# Patient Record
Sex: Female | Born: 2000
Health system: Southern US, Community
[De-identification: ages and names within clinical notes are randomized; demographics above are authoritative.]

## PROBLEM LIST (undated history)

## (undated) ENCOUNTER — Inpatient Hospital Stay (HOSPITAL_COMMUNITY): Payer: Self-pay

## (undated) DIAGNOSIS — Z8759 Personal history of other complications of pregnancy, childbirth and the puerperium: Secondary | ICD-10-CM

## (undated) DIAGNOSIS — S82899A Other fracture of unspecified lower leg, initial encounter for closed fracture: Secondary | ICD-10-CM

## (undated) DIAGNOSIS — D649 Anemia, unspecified: Secondary | ICD-10-CM

## (undated) DIAGNOSIS — N390 Urinary tract infection, site not specified: Secondary | ICD-10-CM

## (undated) DIAGNOSIS — S42302A Unspecified fracture of shaft of humerus, left arm, initial encounter for closed fracture: Secondary | ICD-10-CM

## (undated) HISTORY — DX: Anemia, unspecified: D64.9

## (undated) HISTORY — DX: Personal history of other complications of pregnancy, childbirth and the puerperium: Z87.59

## (undated) HISTORY — PX: NO PAST SURGERIES: SHX2092

---
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
## 2020-11-02 NOTE — L&D Delivery Note (Signed)
Delivery Note Labor onset: 08/07/2021  Labor Onset Time: 08/07/2021 @2000  Complete dilation at 4:03 PM  Onset of pushing at 4:03 PM FHR second stage Cat 1 Analgesia/Anesthesia intrapartum: Epidural  Guided pushing with maternal urge. Delivery of a viable female at 54. Fetal head delivered in ROP position.  Nuchal cord: none.  Infant placed on maternal abd, dried, and tactile stim.  Cord double clamped after 2 minutes and cut by dad.  Dr. 60 and 3 Rns present for birth.  Cord blood sample collected: yes Arterial cord blood sample collected: no  Placenta was retained and delivered by manual removal by Dr. Normand Sloop at 25 minutes after delivery.  Appeared intact, with 3 VC.  Placenta to L&D. Uterine tone boggy with brisk bleeding after placenta delivered. Methergine given and pitocin was already running. Uterine tone was firm and bleeding was WNL.   No laceration identified.  Repair: none QBL/EBL (mL): 400 Complications: Retained placenta APGAR: APGAR (1 MIN): 9   APGAR (5 MINS): 9   APGAR (10 MINS):   Mom to postpartum.  Baby to Couplet care / Skin to Skin.  Plan for IV Unasyn and methergine course for 24 hours per Dr. Normand Sloop.    Normand Sloop MSN, CNM 08/08/2021, 6:03 PM

## 2021-01-09 LAB — OB RESULTS CONSOLE ABO/RH: RH Type: POSITIVE

## 2021-01-09 LAB — OB RESULTS CONSOLE GC/CHLAMYDIA
Chlamydia: NEGATIVE
Gonorrhea: NEGATIVE

## 2021-01-09 LAB — OB RESULTS CONSOLE RUBELLA ANTIBODY, IGM: Rubella: IMMUNE

## 2021-01-09 LAB — OB RESULTS CONSOLE ANTIBODY SCREEN: Antibody Screen: NEGATIVE

## 2021-01-09 LAB — OB RESULTS CONSOLE RPR: RPR: NONREACTIVE

## 2021-01-09 LAB — OB RESULTS CONSOLE HIV ANTIBODY (ROUTINE TESTING): HIV: NONREACTIVE

## 2021-01-09 LAB — OB RESULTS CONSOLE HEPATITIS B SURFACE ANTIGEN: Hepatitis B Surface Ag: NEGATIVE

## 2021-01-11 ENCOUNTER — Encounter (HOSPITAL_COMMUNITY): Payer: Self-pay | Admitting: Obstetrics and Gynecology

## 2021-01-11 ENCOUNTER — Inpatient Hospital Stay (HOSPITAL_COMMUNITY)
Admission: AD | Admit: 2021-01-11 | Discharge: 2021-01-11 | Disposition: A | Discharge status: Home / Self Care | Payer: BC Managed Care – PPO | Source: Ambulatory Visit | Attending: Obstetrics and Gynecology | Admitting: Obstetrics and Gynecology

## 2021-01-11 ENCOUNTER — Other Ambulatory Visit: Payer: Self-pay

## 2021-01-11 DIAGNOSIS — Z3A11 11 weeks gestation of pregnancy: Secondary | ICD-10-CM | POA: Diagnosis not present

## 2021-01-11 DIAGNOSIS — O219 Vomiting of pregnancy, unspecified: Secondary | ICD-10-CM

## 2021-01-11 DIAGNOSIS — O210 Mild hyperemesis gravidarum: Secondary | ICD-10-CM | POA: Diagnosis not present

## 2021-01-11 DIAGNOSIS — O368310 Maternal care for abnormalities of the fetal heart rate or rhythm, first trimester, not applicable or unspecified: Secondary | ICD-10-CM

## 2021-01-11 DIAGNOSIS — O368390 Maternal care for abnormalities of the fetal heart rate or rhythm, unspecified trimester, not applicable or unspecified: Secondary | ICD-10-CM

## 2021-01-11 LAB — URINALYSIS, ROUTINE W REFLEX MICROSCOPIC
Bilirubin Urine: NEGATIVE
Glucose, UA: NEGATIVE mg/dL
Ketones, ur: 20 mg/dL — AB
Nitrite: NEGATIVE
Protein, ur: 30 mg/dL — AB
RBC / HPF: >50 RBC/hpf — ABNORMAL HIGH (ref 0–5)
Specific Gravity, Urine: 1.025 (ref 1.005–1.030)
pH: 5 (ref 5.0–8.0)

## 2021-01-11 LAB — POCT PREGNANCY, URINE: Preg Test, Ur: POSITIVE — AB

## 2021-01-11 MED ORDER — LACTATED RINGERS IV BOLUS
1000.0000 mL | Freq: Once | INTRAVENOUS | Status: AC
  Administered 2021-01-11: 1000 mL via INTRAVENOUS

## 2021-01-11 MED ORDER — PROMETHAZINE HCL 25 MG PO TABS: 25.0000 mg | ORAL_TABLET | Freq: Three times a day (TID) | ORAL | 0 refills | Status: DC | PRN

## 2021-01-11 MED ORDER — FAMOTIDINE 40 MG PO TABS: 40.0000 mg | ORAL_TABLET | Freq: Every evening | ORAL | 0 refills | Status: AC

## 2021-01-11 MED ORDER — FAMOTIDINE IN NACL 20-0.9 MG/50ML-% IV SOLN
20.0000 mg | Freq: Once | INTRAVENOUS | Status: AC
  Administered 2021-01-11: 20 mg via INTRAVENOUS
  Filled 2021-01-11: qty 50

## 2021-01-11 MED ORDER — PROMETHAZINE HCL 25 MG/ML IJ SOLN
25.0000 mg | Freq: Once | INTRAMUSCULAR | Status: AC
  Administered 2021-01-11: 25 mg via INTRAVENOUS
  Filled 2021-01-11: qty 1

## 2021-01-11 MED ORDER — PROMETHAZINE HCL 25 MG RE SUPP: 25.0000 mg | Freq: Three times a day (TID) | RECTAL | 0 refills | Status: DC | PRN

## 2021-01-11 NOTE — Discharge Instructions (Signed)
Morning Sickness  Morning sickness is when a woman feels nauseous during pregnancy. This nauseous feeling may or may not come with vomiting. It often occurs in the morning, but it can be a problem at any time of day. Morning sickness is most common during the first trimester. In some cases, it may continue throughout pregnancy. Although morning sickness is unpleasant, it is usually harmless unless the woman develops severe and continual vomiting (hyperemesis gravidarum), a condition that requires more intense treatment. What are the causes? The exact cause of this condition is not known, but it seems to be related to normal hormonal changes that occur in pregnancy. What increases the risk? You are more likely to develop this condition if:  You experienced nausea or vomiting before your pregnancy.  You had morning sickness during a previous pregnancy.  You are pregnant with more than one baby, such as twins. What are the signs or symptoms? Symptoms of this condition include:  Nausea.  Vomiting. How is this diagnosed? This condition is usually diagnosed based on your signs and symptoms. How is this treated? In many cases, treatment is not needed for this condition. Making some changes to what you eat may help to control symptoms. Your health care provider may also prescribe or recommend:  Vitamin B6 supplements.  Anti-nausea medicines.  Ginger. Follow these instructions at home: Medicines  Take over-the-counter and prescription medicines only as told by your health care provider. Do not use any prescription, over-the-counter, or herbal medicines for morning sickness without first talking with your health care provider.  Take multivitamins before getting pregnant. This can prevent or decrease the severity of morning sickness in most women. Eating and drinking  Eat a piece of dry toast or crackers before getting out of bed in the morning.  Eat 5 or 6 small meals a day.  Eat dry  and bland foods, such as rice or a baked potato. Foods that are high in carbohydrates are often helpful.  Avoid greasy, fatty, and spicy foods.  Have someone cook for you if the smell of any food causes nausea and vomiting.  If you feel nauseous after taking prenatal vitamins, take the vitamins at night or with a snack.  Eat a protein snack between meals if you are hungry. Nuts, yogurt, and cheese are good options.  Drink fluids throughout the day.  Try ginger ale made with real ginger, ginger tea made from fresh grated ginger, or ginger candies. General instructions  Do not use any products that contain nicotine or tobacco. These products include cigarettes, chewing tobacco, and vaping devices, such as e-cigarettes. If you need help quitting, ask your health care provider.  Get an air purifier to keep the air in your house free of odors.  Get plenty of fresh air.  Try to avoid odors that trigger your nausea.  Consider trying these methods to help relieve symptoms: ? Wearing an acupressure wristband. These wristbands are often worn for seasickness. ? Acupuncture. Contact a health care provider if:  Your home remedies are not working and you need medicine.  You feel dizzy or light-headed.  You are losing weight. Get help right away if:  You have persistent and uncontrolled nausea and vomiting.  You faint.  You have severe pain in your abdomen. Summary  Morning sickness is when a woman feels nauseous during pregnancy. This nauseous feeling may or may not come with vomiting.  Morning sickness is most common during the first trimester.  It often occurs in the   morning, but it can be a problem at any time of day.  In many cases, treatment is not needed for this condition. Making some changes to what you eat may help to control symptoms. This information is not intended to replace advice given to you by your health care provider. Make sure you discuss any questions you have  with your health care provider. Document Revised: 06/03/2020 Document Reviewed: 05/13/2020 Elsevier Patient Education  2021 [employer]  

## 2021-01-11 NOTE — MAU Note (Incomplete)
Pt reports she has had N/V for several days. Not able to keep much down. Has been taking dicleges, zofran, and promethazine with out much relief.

## 2021-01-11 NOTE — MAU Provider Note (Signed)
History     CSN: [id number]  Arrival date and time: 01/11/21 2024   Event Date/Time   First Provider Initiated Contact with Patient 01/11/21 2135      Chief Complaint  Patient presents with  . Nausea  . Emesis   HPI Linzi Ohlinger is a 20 y.o. [name] at [redacted]w[redacted]d who presents with nausea & vomiting. This has been an ongoing issue with her pregnancy. Was previously taking diclegis & zofran; saw her ob on Thursday & was prescribed phenergan - was told to stop taking diclegis & zofran.  Took phenergan at 9 am this morning. No treatment for her symptoms since then. Reports vomiting 4 times today. Endorses some epigastric tenderness. Denies abdominal pain, fever, vaginal bleeding, diarrhea. Goes to [facility]; has appointment at the end of this month.   OB History    Gravida  2   Para  1   Term  1   Preterm      AB      Living  1     SAB      IAB      Ectopic      Multiple  0   Live Births  1           Past Medical History:  Diagnosis Date  . Anemia   . Ankle fracture    Right  . Arm fracture, left   . UTI (urinary tract infection)     Past Surgical History:  Procedure Laterality Date  . NO PAST SURGERIES      History reviewed. No pertinent family history.  Social History   Tobacco Use  . Smoking status: Never Smoker  . Smokeless tobacco: Never Used  Substance Use Topics  . Alcohol use: Never  . Drug use: Never    Allergies: No Known Allergies  Medications Prior to Admission  Medication Sig Dispense Refill Last Dose  . Doxylamine-Pyridoxine 10-10 MG TBEC Take 2 tablets by mouth daily.     . ondansetron (ZOFRAN-ODT) 4 MG disintegrating tablet Take 4 mg by mouth 2 (two) times daily as needed.     . Prenatal Vit-Fe Fumarate-FA (PRENATAL MULTIVITAMIN) TABS tablet Take 1 tablet by mouth daily at 12 noon.     . [DISCONTINUED] promethazine (PHENERGAN) 25 MG suppository SMARTSIG:2 SUPPOS Rectally Twice Daily PRN     . [DISCONTINUED] promethazine  (PHENERGAN) 25 MG tablet Take 25 mg by mouth every 6 (six) hours as needed.       Review of Systems  Constitutional: Negative.   Gastrointestinal: Positive for nausea and vomiting. Negative for abdominal pain, constipation and diarrhea.  Genitourinary: Negative.    Physical Exam   Blood pressure 108/74, pulse (!) 103, temperature 98.4 F (36.9 C), resp. rate 18, height 5\' 7"  (1.702 m), weight 76.2 kg, last menstrual period 10/26/2020, unknown if currently breastfeeding.  Physical Exam Vitals and nursing note reviewed.  Constitutional:      General: She is not in acute distress.    Appearance: Normal appearance.  HENT:     Head: Normocephalic and atraumatic.  Pulmonary:     Effort: Pulmonary effort is normal. No respiratory distress.  Neurological:     Mental Status: She is alert.  Psychiatric:        Mood and Affect: Mood normal.        Behavior: Behavior normal.     MAU Course  Procedures Results for orders placed or performed during the hospital encounter of 01/11/21 (from the past 24 hour(s))  Pregnancy, urine POC     Status: Abnormal   Collection Time: 01/11/21  8:46 PM  Result Value Ref Range   Preg Test, Ur POSITIVE (A) NEGATIVE  Urinalysis, Routine w reflex microscopic Urine, Clean Catch     Status: Abnormal   Collection Time: 01/11/21  9:00 PM  Result Value Ref Range   Color, Urine AMBER (A) YELLOW   APPearance CLOUDY (A) CLEAR   Specific Gravity, Urine 1.025 1.005 - 1.030   pH 5.0 5.0 - 8.0   Glucose, UA NEGATIVE NEGATIVE mg/dL   Hgb urine dipstick LARGE (A) NEGATIVE   Bilirubin Urine NEGATIVE NEGATIVE   Ketones, ur 20 (A) NEGATIVE mg/dL   Protein, ur 30 (A) NEGATIVE mg/dL   Nitrite NEGATIVE NEGATIVE   Leukocytes,Ua MODERATE (A) NEGATIVE   RBC / HPF >50 (H) 0 - 5 RBC/hpf   WBC, UA 11-20 0 - 5 WBC/hpf   Bacteria, UA MANY (A) NONE SEEN   Squamous Epithelial / LPF 21-50 0 - 5   Mucus PRESENT     MDM RN unable to doppler FHT. BSUS performed.   Pt  informed that the ultrasound is considered a limited OB ultrasound and is not intended to be a complete ultrasound exam.  Patient also informed that the ultrasound is not being completed with the intent of assessing for fetal or placental anomalies or any pelvic abnormalities.  Explained that the purpose of today's ultrasound is to assess for  viability.  Patient acknowledges the purpose of the exam and the limitations of the study.  Active fetus, FHR 174 bpm.   IV fluids, phenergan, and pepcid given. No vomiting in MAU and reports improvement in symptoms. Discussed taking at home meds on a schedule to cover symptoms. Will add pepcid to meds.    Assessment and Plan   1. Nausea and vomiting during pregnancy prior to [redacted] weeks gestation   2. Unable to hear fetal heart tones as reason for ultrasound scan   3. [redacted] weeks gestation of pregnancy    -Rx pepcid -continued zofran & phenergan on a schedule -reviewed reasons to return to MAU  Judeth Horn 01/11/2021, 11:19 PM

## 2021-01-13 LAB — CULTURE, OB URINE

## 2021-03-25 DIAGNOSIS — G43909 Migraine, unspecified, not intractable, without status migrainosus: Secondary | ICD-10-CM | POA: Insufficient documentation

## 2021-06-11 ENCOUNTER — Inpatient Hospital Stay (HOSPITAL_COMMUNITY)
Admission: AD | Admit: 2021-06-11 | Discharge: 2021-06-12 | Disposition: A | Discharge status: Home / Self Care | Payer: BC Managed Care – PPO | Attending: Obstetrics and Gynecology | Admitting: Obstetrics and Gynecology

## 2021-06-11 ENCOUNTER — Encounter (HOSPITAL_COMMUNITY): Payer: Self-pay | Admitting: Obstetrics and Gynecology

## 2021-06-11 ENCOUNTER — Other Ambulatory Visit: Payer: Self-pay

## 2021-06-11 DIAGNOSIS — N3000 Acute cystitis without hematuria: Secondary | ICD-10-CM

## 2021-06-11 DIAGNOSIS — R102 Pelvic and perineal pain: Secondary | ICD-10-CM | POA: Diagnosis present

## 2021-06-11 DIAGNOSIS — Z3A32 32 weeks gestation of pregnancy: Secondary | ICD-10-CM | POA: Diagnosis not present

## 2021-06-11 DIAGNOSIS — O4700 False labor before 37 completed weeks of gestation, unspecified trimester: Secondary | ICD-10-CM

## 2021-06-11 DIAGNOSIS — O4703 False labor before 37 completed weeks of gestation, third trimester: Secondary | ICD-10-CM | POA: Diagnosis not present

## 2021-06-11 NOTE — MAU Note (Signed)
..  Beverly Ramos is a 20 y.o. at [redacted]w[redacted]d here in MAU reporting: Vaginal pain that is constant and began today. She took tylenol and cranberry pill because she says it is similar to her UTI pain. Denies contractions and vaginal bleeding. +FM.  Pain score: 7/10  Lab orders placed from triage: UA

## 2021-06-12 DIAGNOSIS — O4703 False labor before 37 completed weeks of gestation, third trimester: Secondary | ICD-10-CM

## 2021-06-12 DIAGNOSIS — Z3A32 32 weeks gestation of pregnancy: Secondary | ICD-10-CM | POA: Diagnosis not present

## 2021-06-12 LAB — URINALYSIS, ROUTINE W REFLEX MICROSCOPIC
Bilirubin Urine: NEGATIVE
Glucose, UA: NEGATIVE mg/dL
Hgb urine dipstick: NEGATIVE
Ketones, ur: NEGATIVE mg/dL
Leukocytes,Ua: NEGATIVE
Nitrite: POSITIVE — AB
Protein, ur: NEGATIVE mg/dL
Specific Gravity, Urine: 1.003 — ABNORMAL LOW (ref 1.005–1.030)
pH: 7 (ref 5.0–8.0)

## 2021-06-12 MED ORDER — NITROFURANTOIN MONOHYD MACRO 100 MG PO CAPS: 100.0000 mg | ORAL_CAPSULE | Freq: Two times a day (BID) | ORAL | 1 refills | Status: DC

## 2021-06-12 MED ORDER — NIFEDIPINE 10 MG PO CAPS
10.0000 mg | ORAL_CAPSULE | ORAL | Status: AC | PRN
  Administered 2021-06-12 (×4): 10 mg via ORAL
  Filled 2021-06-12 (×4): qty 1

## 2021-06-12 MED ORDER — AMMONIA AROMATIC IN INHA
RESPIRATORY_TRACT | Status: AC
  Filled 2021-06-12: qty 10

## 2021-06-12 NOTE — MAU Provider Note (Signed)
Chief Complaint:  Pelvic Pain   Event Date/Time   First Provider Initiated Contact with Patient 06/12/21 0001     HPI: Beverly Ramos is a 20 y.o. [name] at 56w5dwho presents to maternity admissions reporting burning in vagina.  States it feels like UTIs she has had in the past.  Tried several remedies without success.  No fever or flank pain. . She reports good fetal movement, denies LOF, vaginal bleeding, vaginal itching/burning, urinary symptoms, h/a, dizziness, n/v, diarrhea, constipation or fever/chills.  She denies headache, visual changes or RUQ abdominal pain.  Pelvic Pain The patient's primary symptoms include pelvic pain. The patient's pertinent negatives include no genital itching, genital lesions, genital odor or vaginal bleeding. This is a new problem. The current episode started today. The problem has been unchanged. She is pregnant. Pertinent negatives include no back pain, chills, constipation, diarrhea, fever, frequency, nausea or vomiting. Nothing aggravates the symptoms. Treatments tried: Tylenol, cranberry pills. The treatment provided no relief.   RN Note: Beverly Ramos is a 20 y.o. at [redacted]w[redacted]d here in MAU reporting: Vaginal pain that is constant and began today. She took tylenol and cranberry pill because she says it is similar to her UTI pain. Denies contractions and vaginal bleeding. +FM.    Past Medical History: Past Medical History:  Diagnosis Date   Anemia    Ankle fracture    Right   Arm fracture, left    UTI (urinary tract infection)     Past obstetric history: OB History  Gravida Para Term Preterm AB Living  2 1 1     1   SAB IAB Ectopic Multiple Live Births        0 1    # Outcome Date GA Lbr Len/2nd Weight Sex Delivery Anes PTL Lv  2 Current           1 Term 05/25/18 [redacted]w[redacted]d 02:15 / 03:07 4145 g M Vag-Spont EPI  LIV    Past Surgical History: Past Surgical History:  Procedure Laterality Date   NO PAST SURGERIES      Family History: History  reviewed. No pertinent family history.  Social History: Social History   Tobacco Use   Smoking status: Never   Smokeless tobacco: Never  Vaping Use   Vaping Use: Never used  Substance Use Topics   Alcohol use: Never   Drug use: Never    Allergies: No Known Allergies  Meds:  Medications Prior to Admission  Medication Sig Dispense Refill Last Dose   Prenatal Vit-Fe Fumarate-FA (PRENATAL MULTIVITAMIN) TABS tablet Take 1 tablet by mouth daily at 12 noon.   06/10/2021   famotidine (PEPCID) 40 MG tablet Take 1 tablet (40 mg total) by mouth every evening. 30 tablet 0    ondansetron (ZOFRAN-ODT) 4 MG disintegrating tablet Take 4 mg by mouth 2 (two) times daily as needed.      promethazine (PHENERGAN) 25 MG suppository Place 1 suppository (25 mg total) rectally every 8 (eight) hours as needed for nausea or vomiting. 12 each 0    promethazine (PHENERGAN) 25 MG tablet Take 1 tablet (25 mg total) by mouth every 8 (eight) hours as needed. 30 tablet 0     I have reviewed patient's Past Medical Hx, Surgical Hx, Family Hx, Social Hx, medications and allergies.   ROS:  Review of Systems  Constitutional:  Negative for chills and fever.  Gastrointestinal:  Negative for constipation, diarrhea, nausea and vomiting.  Genitourinary:  Positive for pelvic pain. Negative for frequency.  Musculoskeletal:  Negative for back pain.  Other systems negative  Physical Exam  Patient Vitals for the past 24 hrs:  BP Temp Temp src Pulse Resp SpO2 Height Weight  06/11/21 2348 121/73 97.8 F (36.6 C) Oral 80 18 99 % -- --  06/11/21 2341 -- -- -- -- -- -- 5\' 7"  (1.702 m) 95 kg   Constitutional: Well-developed, well-nourished female in no acute distress.  Cardiovascular: normal rate and rhythm Respiratory: normal effort, clear to auscultation bilaterally GI: Abd soft, non-tender, gravid appropriate for gestational age.   No rebound or guarding. MS: Extremities nontender, no edema, normal ROM Neurologic: Alert  and oriented x 4.  GU: Neg CVAT.  PELVIC EXAM:  Cervix long and closed  FHT:  Baseline 140 , moderate variability, accelerations present, no decelerations Contractions: q 2-3 mins Irregular    Labs: Results for orders placed or performed during the hospital encounter of 06/11/21 (from the past 24 hour(s))  Urinalysis, Routine w reflex microscopic Urine, Clean Catch     Status: Abnormal   Collection Time: 06/11/21 11:41 PM  Result Value Ref Range   Color, Urine AMBER (A) YELLOW   APPearance CLEAR CLEAR   Specific Gravity, Urine 1.003 (L) 1.005 - 1.030   pH 7.0 5.0 - 8.0   Glucose, UA NEGATIVE NEGATIVE mg/dL   Hgb urine dipstick NEGATIVE NEGATIVE   Bilirubin Urine NEGATIVE NEGATIVE   Ketones, ur NEGATIVE NEGATIVE mg/dL   Protein, ur NEGATIVE NEGATIVE mg/dL   Nitrite POSITIVE (A) NEGATIVE   Leukocytes,Ua NEGATIVE NEGATIVE   RBC / HPF 0-5 0 - 5 RBC/hpf   WBC, UA 0-5 0 - 5 WBC/hpf   Bacteria, UA RARE (A) NONE SEEN   Squamous Epithelial / LPF 0-5 0 - 5    Imaging:  No results found.  MAU Course/MDM: I have ordered labs and reviewed results. UA is suggestive of UTI. Sent to culture NST reviewed, reactive .  Treatments in MAU included Procardia series which did resolve preterm contractions   Discussed results of UA, will treat presumptively.  Recheck of cervix, unchanged   Assessment: Single IUP at [redacted]w[redacted]d Pelvic cramping Preterm uterine contractions Nitrites in urine, presumed UTI  Plan: Discharge home Rx Macrobid for UTI Urine to culture Preterm Labor precautions and fetal kick counts Follow up in Office for prenatal visits Encouraged to return if she develops worsening of symptoms, increase in pain, fever, or other concerning symptoms.  Pt stable at time of discharge.  [redacted]w[redacted]d CNM, MSN Certified Nurse-Midwife 06/12/2021 12:01 AM

## 2021-06-13 LAB — CULTURE, OB URINE: Culture: NO GROWTH

## 2021-06-19 ENCOUNTER — Other Ambulatory Visit: Payer: Self-pay | Admitting: Obstetrics and Gynecology

## 2021-06-19 DIAGNOSIS — R109 Unspecified abdominal pain: Secondary | ICD-10-CM

## 2021-06-24 ENCOUNTER — Ambulatory Visit
Admission: RE | Admit: 2021-06-24 | Discharge: 2021-06-24 | Disposition: A | Discharge status: Home / Self Care | Payer: BC Managed Care – PPO | Source: Ambulatory Visit | Attending: Obstetrics and Gynecology | Admitting: Obstetrics and Gynecology

## 2021-06-24 ENCOUNTER — Other Ambulatory Visit: Payer: Self-pay

## 2021-06-24 DIAGNOSIS — R109 Unspecified abdominal pain: Secondary | ICD-10-CM

## 2021-06-30 ENCOUNTER — Encounter (HOSPITAL_COMMUNITY): Payer: Self-pay | Admitting: Obstetrics and Gynecology

## 2021-06-30 ENCOUNTER — Inpatient Hospital Stay (HOSPITAL_COMMUNITY)
Admission: AD | Admit: 2021-06-30 | Discharge: 2021-06-30 | Disposition: A | Discharge status: Home / Self Care | Payer: BC Managed Care – PPO | Attending: Obstetrics and Gynecology | Admitting: Obstetrics and Gynecology

## 2021-06-30 ENCOUNTER — Other Ambulatory Visit: Payer: Self-pay

## 2021-06-30 DIAGNOSIS — O26853 Spotting complicating pregnancy, third trimester: Secondary | ICD-10-CM | POA: Diagnosis present

## 2021-06-30 DIAGNOSIS — Z3A35 35 weeks gestation of pregnancy: Secondary | ICD-10-CM | POA: Diagnosis not present

## 2021-06-30 DIAGNOSIS — O4693 Antepartum hemorrhage, unspecified, third trimester: Secondary | ICD-10-CM | POA: Diagnosis not present

## 2021-06-30 DIAGNOSIS — Z79899 Other long term (current) drug therapy: Secondary | ICD-10-CM | POA: Insufficient documentation

## 2021-06-30 LAB — POCT FERN TEST: POCT Fern Test: NEGATIVE

## 2021-06-30 NOTE — MAU Note (Signed)
Pt reports at 1540 she was sitting and stood up and felt wet. Pt pulled down her pants in the bathroom and saw a brownish red stain.

## 2021-06-30 NOTE — MAU Provider Note (Signed)
History     CSN: [id number]  Arrival date and time: 06/30/21 1628   Event Date/Time   First Provider Initiated Contact with Patient 06/30/21 1722      Chief Complaint  Patient presents with   Rupture of Membranes   HPI Beverly Ramos  20 y.o. [redacted]w[redacted]d Comes to MAU today with brown liquid that wet her underwear and she had to change her clothes.  Thinks her water has broken.  Baby is moving well.  No history of cervical exam or intercourse in the last week.  OB History     Gravida  2   Para  1   Term  1   Preterm      AB      Living  1      SAB      IAB      Ectopic      Multiple  0   Live Births  1           Past Medical History:  Diagnosis Date   Anemia    Ankle fracture    Right   Arm fracture, left    UTI (urinary tract infection)     Past Surgical History:  Procedure Laterality Date   NO PAST SURGERIES      History reviewed. No pertinent family history.  Social History   Tobacco Use   Smoking status: Never   Smokeless tobacco: Never  Vaping Use   Vaping Use: Never used  Substance Use Topics   Alcohol use: Never   Drug use: Never    Allergies: No Known Allergies  Medications Prior to Admission  Medication Sig Dispense Refill Last Dose   Doxylamine-Pyridoxine 10-10 MG TBEC Take 2 tablets by mouth daily.      famotidine (PEPCID) 40 MG tablet Take 1 tablet (40 mg total) by mouth every evening. 30 tablet 0    nitrofurantoin, macrocrystal-monohydrate, (MACROBID) 100 MG capsule Take 1 capsule (100 mg total) by mouth 2 (two) times daily. 14 capsule 1    ondansetron (ZOFRAN-ODT) 4 MG disintegrating tablet Take 4 mg by mouth 2 (two) times daily as needed.      Prenatal Vit-Fe Fumarate-FA (PRENATAL MULTIVITAMIN) TABS tablet Take 1 tablet by mouth daily at 12 noon.      promethazine (PHENERGAN) 25 MG tablet Take 1 tablet (25 mg total) by mouth every 8 (eight) hours as needed. 30 tablet 0     Review of Systems  Constitutional:  Negative  for fever.  Respiratory:  Negative for shortness of breath and wheezing.   Gastrointestinal:  Negative for abdominal pain.  Genitourinary:  Positive for vaginal bleeding. Negative for dysuria and vaginal discharge.   Physical Exam   Blood pressure (!) 104/57, pulse 87, temperature 98.8 F (37.1 C), resp. rate 16, last menstrual period 10/26/2020, SpO2 99 %, unknown if currently breastfeeding.  Physical Exam Vitals and nursing note reviewed.  Constitutional:      Appearance: She is well-developed.  HENT:     Head: Normocephalic.  Cardiovascular:     Rate and Rhythm: Regular rhythm.  Pulmonary:     Effort: Pulmonary effort is normal.  Abdominal:     Palpations: Abdomen is soft.     Tenderness: There is no abdominal tenderness. There is no guarding or rebound.     Comments: FHT baseline 135 with moderate variability and 15x15 accels noted Occasional contractions No decelerations Category 1 strip  Genitourinary:    Comments: Speculum exam: Vagina - Small  amount of old blood - one large swab amount, no active bleeding, no pooling Cervix - No contact bleeding, no leaking with valsalva Bimanual exam: Cervix closed and thick - presenting part is high Chaperone present for exam.  Musculoskeletal:        General: Normal range of motion.     Cervical back: Neck supple.  Skin:    General: Skin is warm and dry.  Neurological:     Mental Status: She is alert and oriented to person, place, and time.  Psychiatric:        Mood and Affect: Mood normal.        Behavior: Behavior normal.        Thought Content: Thought content normal.    MAU Course  Procedures  MDM Old blood possibly from late pregnancy friable cervix.  Discussed low threshold for return especially if bleeding is bright red rather than brown.  Reviewed mucus plug - thick consistency.  To return if having any reasons for concern - bleeding, baby not moving, severe pain. Blood type A positive Fern slide  negative  Assessment and Plan  Old blood in vagina [redacted]w[redacted]d Not in labor, Not actively bleeding  Plan Keep the appointment in the office on Sept. 1 Pelvic rest Return with any bright red bleeding or worsening pain  Currie Paris 06/30/2021, 6:17 PM

## 2021-07-12 LAB — OB RESULTS CONSOLE GBS: GBS: NEGATIVE

## 2021-07-29 ENCOUNTER — Other Ambulatory Visit: Payer: Self-pay | Admitting: Obstetrics and Gynecology

## 2021-08-01 ENCOUNTER — Telehealth (HOSPITAL_COMMUNITY): Payer: Self-pay | Admitting: *Deleted

## 2021-08-04 ENCOUNTER — Encounter (HOSPITAL_COMMUNITY): Payer: Self-pay

## 2021-08-04 ENCOUNTER — Encounter (HOSPITAL_COMMUNITY): Payer: Self-pay | Admitting: *Deleted

## 2021-08-04 NOTE — Telephone Encounter (Signed)
Preadmission screen  

## 2021-08-08 ENCOUNTER — Inpatient Hospital Stay (HOSPITAL_COMMUNITY)
Admission: AD | Admit: 2021-08-08 | Discharge: 2021-08-08 | Disposition: A | Discharge status: Home / Self Care | Payer: BC Managed Care – PPO | Source: Home / Self Care | Attending: Obstetrics & Gynecology | Admitting: Obstetrics & Gynecology

## 2021-08-08 ENCOUNTER — Inpatient Hospital Stay (HOSPITAL_COMMUNITY): Payer: BC Managed Care – PPO | Admitting: Certified Registered Nurse Anesthetist

## 2021-08-08 ENCOUNTER — Other Ambulatory Visit: Payer: Self-pay

## 2021-08-08 ENCOUNTER — Inpatient Hospital Stay (HOSPITAL_COMMUNITY)
Admission: AD | Admit: 2021-08-08 | Discharge: 2021-08-10 | DRG: 768 | Disposition: A | Discharge status: Home / Self Care | Payer: BC Managed Care – PPO | Attending: Obstetrics and Gynecology | Admitting: Obstetrics and Gynecology

## 2021-08-08 ENCOUNTER — Inpatient Hospital Stay (HOSPITAL_COMMUNITY): Payer: BC Managed Care – PPO | Admitting: Anesthesiology

## 2021-08-08 ENCOUNTER — Inpatient Hospital Stay (HOSPITAL_COMMUNITY): Payer: BC Managed Care – PPO

## 2021-08-08 ENCOUNTER — Encounter (HOSPITAL_COMMUNITY): Payer: Self-pay | Admitting: Obstetrics and Gynecology

## 2021-08-08 ENCOUNTER — Encounter (HOSPITAL_COMMUNITY)
Admission: AD | Disposition: A | Discharge status: Home / Self Care | Payer: Self-pay | Source: Home / Self Care | Attending: Obstetrics and Gynecology

## 2021-08-08 ENCOUNTER — Encounter (HOSPITAL_COMMUNITY): Payer: Self-pay | Admitting: Obstetrics & Gynecology

## 2021-08-08 DIAGNOSIS — Z20822 Contact with and (suspected) exposure to covid-19: Secondary | ICD-10-CM | POA: Diagnosis present

## 2021-08-08 DIAGNOSIS — R8271 Bacteriuria: Secondary | ICD-10-CM | POA: Diagnosis not present

## 2021-08-08 DIAGNOSIS — O480 Post-term pregnancy: Secondary | ICD-10-CM | POA: Insufficient documentation

## 2021-08-08 DIAGNOSIS — R109 Unspecified abdominal pain: Secondary | ICD-10-CM | POA: Insufficient documentation

## 2021-08-08 DIAGNOSIS — O2693 Pregnancy related conditions, unspecified, third trimester: Secondary | ICD-10-CM | POA: Insufficient documentation

## 2021-08-08 DIAGNOSIS — O479 False labor, unspecified: Secondary | ICD-10-CM | POA: Diagnosis present

## 2021-08-08 DIAGNOSIS — Z3A40 40 weeks gestation of pregnancy: Secondary | ICD-10-CM

## 2021-08-08 DIAGNOSIS — Z87442 Personal history of urinary calculi: Secondary | ICD-10-CM | POA: Diagnosis not present

## 2021-08-08 DIAGNOSIS — Z9889 Other specified postprocedural states: Secondary | ICD-10-CM

## 2021-08-08 DIAGNOSIS — D62 Acute posthemorrhagic anemia: Secondary | ICD-10-CM | POA: Diagnosis not present

## 2021-08-08 DIAGNOSIS — Z87440 Personal history of urinary (tract) infections: Secondary | ICD-10-CM

## 2021-08-08 DIAGNOSIS — O9081 Anemia of the puerperium: Secondary | ICD-10-CM | POA: Diagnosis not present

## 2021-08-08 DIAGNOSIS — O99891 Other specified diseases and conditions complicating pregnancy: Secondary | ICD-10-CM | POA: Diagnosis not present

## 2021-08-08 DIAGNOSIS — O26893 Other specified pregnancy related conditions, third trimester: Secondary | ICD-10-CM | POA: Diagnosis present

## 2021-08-08 HISTORY — PX: DILATION AND CURETTAGE OF UTERUS: SHX78

## 2021-08-08 LAB — CBC
HCT: 39.8 % (ref 36.0–46.0)
HCT: 36.7 % (ref 36.0–46.0)
Hemoglobin: 13.6 g/dL (ref 12.0–15.0)
Hemoglobin: 12.3 g/dL (ref 12.0–15.0)
MCH: 30.8 pg (ref 26.0–34.0)
MCH: 30.4 pg (ref 26.0–34.0)
MCHC: 34.2 g/dL (ref 30.0–36.0)
MCHC: 33.5 g/dL (ref 30.0–36.0)
MCV: 90.2 fL (ref 80.0–100.0)
MCV: 90.8 fL (ref 80.0–100.0)
Platelets: 167 10*3/uL (ref 150–400)
Platelets: 175 10*3/uL (ref 150–400)
RBC: 4.41 MIL/uL (ref 3.87–5.11)
RBC: 4.04 MIL/uL (ref 3.87–5.11)
RDW: 13.3 % (ref 11.5–15.5)
RDW: 13.3 % (ref 11.5–15.5)
WBC: 9.4 10*3/uL (ref 4.0–10.5)
WBC: 14.4 10*3/uL — ABNORMAL HIGH (ref 4.0–10.5)
nRBC: 0 % (ref 0.0–0.2)
nRBC: 0.1 % (ref 0.0–0.2)

## 2021-08-08 LAB — PREPARE RBC (CROSSMATCH)

## 2021-08-08 LAB — POCT FERN TEST: POCT Fern Test: NEGATIVE

## 2021-08-08 LAB — PROTIME-INR
INR: 1.1 (ref 0.8–1.2)
Prothrombin Time: 14 seconds (ref 11.4–15.2)

## 2021-08-08 LAB — APTT: aPTT: 24 seconds (ref 24–36)

## 2021-08-08 LAB — RAPID HIV SCREEN (HIV 1/2 AB+AG)
HIV 1/2 Antibodies: NONREACTIVE
HIV-1 P24 Antigen - HIV24: NONREACTIVE

## 2021-08-08 LAB — RESP PANEL BY RT-PCR (FLU A&B, COVID) ARPGX2
Influenza A by PCR: NEGATIVE
Influenza B by PCR: NEGATIVE
SARS Coronavirus 2 by RT PCR: NEGATIVE

## 2021-08-08 LAB — FIBRINOGEN: Fibrinogen: 293 mg/dL (ref 210–475)

## 2021-08-08 SURGERY — DILATION AND CURETTAGE
Anesthesia: Epidural

## 2021-08-08 MED ORDER — ACETAMINOPHEN 325 MG PO TABS: 650.0000 mg | ORAL_TABLET | ORAL | Status: DC | PRN

## 2021-08-08 MED ORDER — DIPHENHYDRAMINE HCL 50 MG/ML IJ SOLN: 12.5000 mg | INTRAMUSCULAR | Status: DC | PRN

## 2021-08-08 MED ORDER — SODIUM CHLORIDE 0.9% IV SOLUTION: Freq: Once | INTRAVENOUS | Status: DC

## 2021-08-08 MED ORDER — DIPHENHYDRAMINE HCL 25 MG PO CAPS: 25.0000 mg | ORAL_CAPSULE | Freq: Four times a day (QID) | ORAL | Status: DC | PRN

## 2021-08-08 MED ORDER — OXYTOCIN BOLUS FROM INFUSION
333.0000 mL | Freq: Once | INTRAVENOUS | Status: AC
  Administered 2021-08-08: 333 mL via INTRAVENOUS

## 2021-08-08 MED ORDER — LACTATED RINGERS IV BOLUS
1000.0000 mL | Freq: Once | INTRAVENOUS | Status: AC
  Administered 2021-08-08: 1000 mL via INTRAVENOUS

## 2021-08-08 MED ORDER — ONDANSETRON HCL 4 MG/2ML IJ SOLN: 4.0000 mg | Freq: Four times a day (QID) | INTRAMUSCULAR | Status: DC | PRN

## 2021-08-08 MED ORDER — TRANEXAMIC ACID-NACL 1000-0.7 MG/100ML-% IV SOLN
INTRAVENOUS | Status: AC
  Administered 2021-08-08: 1000 mg via INTRAVENOUS
  Filled 2021-08-08: qty 100

## 2021-08-08 MED ORDER — LACTATED RINGERS IV SOLN: 500.0000 mL | INTRAVENOUS | Status: DC | PRN

## 2021-08-08 MED ORDER — OXYCODONE-ACETAMINOPHEN 5-325 MG PO TABS: 2.0000 | ORAL_TABLET | ORAL | Status: DC | PRN

## 2021-08-08 MED ORDER — CARBOPROST TROMETHAMINE 250 MCG/ML IM SOLN
INTRAMUSCULAR | Status: DC | PRN
  Administered 2021-08-08: 250 ug via INTRAMUSCULAR

## 2021-08-08 MED ORDER — FENTANYL-BUPIVACAINE-NACL 0.5-0.125-0.9 MG/250ML-% EP SOLN
12.0000 mL/h | EPIDURAL | Status: DC | PRN
  Administered 2021-08-08: 12 mL/h via EPIDURAL
  Filled 2021-08-08: qty 250

## 2021-08-08 MED ORDER — METHYLERGONOVINE MALEATE 0.2 MG/ML IJ SOLN
0.2000 mg | INTRAMUSCULAR | Status: DC | PRN
  Administered 2021-08-08: 0.2 mg via INTRAMUSCULAR

## 2021-08-08 MED ORDER — MISOPROSTOL 200 MCG PO TABS
200.0000 ug | ORAL_TABLET | Freq: Four times a day (QID) | ORAL | Status: AC
  Administered 2021-08-08 – 2021-08-09 (×4): 200 ug via ORAL
  Filled 2021-08-08 (×4): qty 1

## 2021-08-08 MED ORDER — DIPHENOXYLATE-ATROPINE 2.5-0.025 MG PO TABS
2.0000 | ORAL_TABLET | Freq: Once | ORAL | Status: AC
  Administered 2021-08-08: 2 via ORAL
  Filled 2021-08-08: qty 2

## 2021-08-08 MED ORDER — KETOROLAC TROMETHAMINE 30 MG/ML IJ SOLN: 30.0000 mg | Freq: Once | INTRAMUSCULAR | Status: DC | PRN

## 2021-08-08 MED ORDER — TRANEXAMIC ACID-NACL 1000-0.7 MG/100ML-% IV SOLN: 1000.0000 mg | INTRAVENOUS | Status: AC

## 2021-08-08 MED ORDER — OXYTOCIN-SODIUM CHLORIDE 30-0.9 UT/500ML-% IV SOLN
INTRAVENOUS | Status: DC | PRN
  Administered 2021-08-08: 50 mL via INTRAVENOUS
  Administered 2021-08-08: 25 mL via INTRAVENOUS

## 2021-08-08 MED ORDER — MIDAZOLAM HCL 2 MG/2ML IJ SOLN
INTRAMUSCULAR | Status: AC
  Filled 2021-08-08: qty 2

## 2021-08-08 MED ORDER — BENZOCAINE-MENTHOL 20-0.5 % EX AERO
1.0000 "application " | INHALATION_SPRAY | CUTANEOUS | Status: DC | PRN
  Administered 2021-08-09: 1 via TOPICAL
  Filled 2021-08-08: qty 56

## 2021-08-08 MED ORDER — CEFAZOLIN SODIUM-DEXTROSE 2-4 GM/100ML-% IV SOLN
INTRAVENOUS | Status: AC
  Filled 2021-08-08: qty 100

## 2021-08-08 MED ORDER — PHENAZOPYRIDINE HCL 100 MG PO TABS
200.0000 mg | ORAL_TABLET | Freq: Three times a day (TID) | ORAL | Status: DC
  Filled 2021-08-08 (×3): qty 2

## 2021-08-08 MED ORDER — OXYCODONE HCL 5 MG PO TABS: 10.0000 mg | ORAL_TABLET | ORAL | Status: DC | PRN

## 2021-08-08 MED ORDER — EPHEDRINE 5 MG/ML INJ: 10.0000 mg | INTRAVENOUS | Status: DC | PRN

## 2021-08-08 MED ORDER — OXYTOCIN-SODIUM CHLORIDE 30-0.9 UT/500ML-% IV SOLN
2.5000 [IU]/h | INTRAVENOUS | Status: DC
  Administered 2021-08-08: 2.5 [IU]/h via INTRAVENOUS
  Filled 2021-08-08: qty 500

## 2021-08-08 MED ORDER — FLEET ENEMA 7-19 GM/118ML RE ENEM: 1.0000 | ENEMA | RECTAL | Status: DC | PRN

## 2021-08-08 MED ORDER — ONDANSETRON HCL 4 MG/2ML IJ SOLN: 4.0000 mg | Freq: Once | INTRAMUSCULAR | Status: DC | PRN

## 2021-08-08 MED ORDER — SIMETHICONE 80 MG PO CHEW: 80.0000 mg | CHEWABLE_TABLET | ORAL | Status: DC | PRN

## 2021-08-08 MED ORDER — LACTATED RINGERS IV SOLN
500.0000 mL | Freq: Once | INTRAVENOUS | Status: AC
  Administered 2021-08-08: 500 mL via INTRAVENOUS

## 2021-08-08 MED ORDER — FENTANYL CITRATE (PF) 100 MCG/2ML IJ SOLN
INTRAMUSCULAR | Status: AC
  Administered 2021-08-08: 100 ug
  Filled 2021-08-08: qty 2

## 2021-08-08 MED ORDER — OXYCODONE HCL 5 MG/5ML PO SOLN: 5.0000 mg | Freq: Once | ORAL | Status: DC | PRN

## 2021-08-08 MED ORDER — MISOPROSTOL 200 MCG PO TABS
ORAL_TABLET | ORAL | Status: AC
  Administered 2021-08-08: 600 ug via RECTAL
  Filled 2021-08-08: qty 5

## 2021-08-08 MED ORDER — COCONUT OIL OIL: 1.0000 "application " | TOPICAL_OIL | Status: DC | PRN

## 2021-08-08 MED ORDER — CARBOPROST TROMETHAMINE 250 MCG/ML IM SOLN: 250.0000 ug | Freq: Once | INTRAMUSCULAR | Status: DC

## 2021-08-08 MED ORDER — CARBOPROST TROMETHAMINE 250 MCG/ML IM SOLN
INTRAMUSCULAR | Status: AC
  Filled 2021-08-08: qty 1

## 2021-08-08 MED ORDER — FENTANYL CITRATE (PF) 100 MCG/2ML IJ SOLN
INTRAMUSCULAR | Status: DC | PRN
  Administered 2021-08-08: 25 ug via INTRAVENOUS
  Administered 2021-08-08: 50 ug via INTRAVENOUS
  Administered 2021-08-08: 25 ug via INTRAVENOUS

## 2021-08-08 MED ORDER — WITCH HAZEL-GLYCERIN EX PADS: 1.0000 "application " | MEDICATED_PAD | CUTANEOUS | Status: DC | PRN

## 2021-08-08 MED ORDER — LACTATED RINGERS IV SOLN: INTRAVENOUS | Status: DC

## 2021-08-08 MED ORDER — CEFAZOLIN SODIUM-DEXTROSE 2-3 GM-%(50ML) IV SOLR
INTRAVENOUS | Status: DC | PRN
  Administered 2021-08-08: 2 g via INTRAVENOUS

## 2021-08-08 MED ORDER — HYDROMORPHONE HCL 1 MG/ML IJ SOLN
0.2500 mg | INTRAMUSCULAR | Status: DC | PRN
  Administered 2021-08-08: 0.5 mg via INTRAVENOUS

## 2021-08-08 MED ORDER — TETANUS-DIPHTH-ACELL PERTUSSIS 5-2.5-18.5 LF-MCG/0.5 IM SUSY: 0.5000 mL | PREFILLED_SYRINGE | Freq: Once | INTRAMUSCULAR | Status: DC

## 2021-08-08 MED ORDER — DIBUCAINE (PERIANAL) 1 % EX OINT: 1.0000 "application " | TOPICAL_OINTMENT | CUTANEOUS | Status: DC | PRN

## 2021-08-08 MED ORDER — LIDOCAINE HCL (PF) 1 % IJ SOLN: 30.0000 mL | INTRAMUSCULAR | Status: DC | PRN

## 2021-08-08 MED ORDER — FENTANYL CITRATE (PF) 100 MCG/2ML IJ SOLN
INTRAMUSCULAR | Status: AC
  Filled 2021-08-08: qty 2

## 2021-08-08 MED ORDER — ONDANSETRON HCL 4 MG/2ML IJ SOLN: 4.0000 mg | INTRAMUSCULAR | Status: DC | PRN

## 2021-08-08 MED ORDER — METHYLERGONOVINE MALEATE 0.2 MG/ML IJ SOLN: 0.2000 mg | INTRAMUSCULAR | Status: AC

## 2021-08-08 MED ORDER — SENNOSIDES-DOCUSATE SODIUM 8.6-50 MG PO TABS
2.0000 | ORAL_TABLET | Freq: Every day | ORAL | Status: DC
  Administered 2021-08-09 – 2021-08-10 (×2): 2 via ORAL
  Filled 2021-08-08 (×2): qty 2

## 2021-08-08 MED ORDER — MIDAZOLAM HCL 2 MG/2ML IJ SOLN
INTRAMUSCULAR | Status: DC | PRN
  Administered 2021-08-08 (×2): 1 mg via INTRAVENOUS

## 2021-08-08 MED ORDER — PHENYLEPHRINE 40 MCG/ML (10ML) SYRINGE FOR IV PUSH (FOR BLOOD PRESSURE SUPPORT): 80.0000 ug | PREFILLED_SYRINGE | INTRAVENOUS | Status: DC | PRN

## 2021-08-08 MED ORDER — ACETAMINOPHEN 325 MG PO TABS
650.0000 mg | ORAL_TABLET | ORAL | Status: DC | PRN
  Administered 2021-08-09 (×2): 650 mg via ORAL
  Filled 2021-08-08 (×2): qty 2

## 2021-08-08 MED ORDER — OXYCODONE-ACETAMINOPHEN 5-325 MG PO TABS: 1.0000 | ORAL_TABLET | ORAL | Status: DC | PRN

## 2021-08-08 MED ORDER — CARBOPROST TROMETHAMINE 250 MCG/ML IM SOLN
INTRAMUSCULAR | Status: AC
  Administered 2021-08-08: 250 ug via INTRAMUSCULAR
  Filled 2021-08-08: qty 1

## 2021-08-08 MED ORDER — IBUPROFEN 600 MG PO TABS
600.0000 mg | ORAL_TABLET | Freq: Four times a day (QID) | ORAL | Status: DC
  Administered 2021-08-08 – 2021-08-10 (×7): 600 mg via ORAL
  Filled 2021-08-08 (×7): qty 1

## 2021-08-08 MED ORDER — LIDOCAINE-EPINEPHRINE (PF) 2 %-1:200000 IJ SOLN
INTRAMUSCULAR | Status: DC | PRN
  Administered 2021-08-08: 4 mL via EPIDURAL

## 2021-08-08 MED ORDER — MISOPROSTOL 200 MCG PO TABS: 600.0000 ug | ORAL_TABLET | Freq: Once | ORAL | Status: AC

## 2021-08-08 MED ORDER — PRENATAL MULTIVITAMIN CH
1.0000 | ORAL_TABLET | Freq: Every day | ORAL | Status: DC
  Administered 2021-08-09 – 2021-08-10 (×2): 1 via ORAL
  Filled 2021-08-08 (×2): qty 1

## 2021-08-08 MED ORDER — OXYCODONE HCL 5 MG PO TABS: 5.0000 mg | ORAL_TABLET | Freq: Once | ORAL | Status: DC | PRN

## 2021-08-08 MED ORDER — SOD CITRATE-CITRIC ACID 500-334 MG/5ML PO SOLN: 30.0000 mL | ORAL | Status: DC | PRN

## 2021-08-08 MED ORDER — LIDOCAINE HCL (PF) 1 % IJ SOLN
INTRAMUSCULAR | Status: DC | PRN
  Administered 2021-08-08: 11 mL via EPIDURAL

## 2021-08-08 MED ORDER — METHYLERGONOVINE MALEATE 0.2 MG PO TABS
0.2000 mg | ORAL_TABLET | ORAL | Status: AC
  Administered 2021-08-09 (×5): 0.2 mg via ORAL
  Filled 2021-08-08 (×6): qty 1

## 2021-08-08 MED ORDER — MISOPROSTOL 200 MCG PO TABS
400.0000 ug | ORAL_TABLET | Freq: Once | ORAL | Status: AC
  Administered 2021-08-08: 400 ug via ORAL

## 2021-08-08 MED ORDER — TRANEXAMIC ACID-NACL 1000-0.7 MG/100ML-% IV SOLN
INTRAVENOUS | Status: DC | PRN
  Administered 2021-08-08: 1000 mg via INTRAVENOUS

## 2021-08-08 MED ORDER — FENTANYL CITRATE (PF) 100 MCG/2ML IJ SOLN
100.0000 ug | Freq: Once | INTRAMUSCULAR | Status: AC
  Administered 2021-08-08: 100 ug via INTRAVENOUS
  Filled 2021-08-08: qty 2

## 2021-08-08 MED ORDER — SODIUM CHLORIDE 0.9 % IV SOLN
3.0000 g | Freq: Four times a day (QID) | INTRAVENOUS | Status: DC
  Administered 2021-08-08 – 2021-08-09 (×5): 3 g via INTRAVENOUS
  Filled 2021-08-08 (×9): qty 8

## 2021-08-08 MED ORDER — HYDROMORPHONE HCL 1 MG/ML IJ SOLN
INTRAMUSCULAR | Status: AC
  Filled 2021-08-08: qty 0.5

## 2021-08-08 MED ORDER — TRANEXAMIC ACID-NACL 1000-0.7 MG/100ML-% IV SOLN
INTRAVENOUS | Status: AC
  Filled 2021-08-08: qty 100

## 2021-08-08 MED ORDER — ZOLPIDEM TARTRATE 5 MG PO TABS: 5.0000 mg | ORAL_TABLET | Freq: Every evening | ORAL | Status: DC | PRN

## 2021-08-08 MED ORDER — OXYTOCIN-SODIUM CHLORIDE 30-0.9 UT/500ML-% IV SOLN
INTRAVENOUS | Status: AC
  Filled 2021-08-08: qty 500

## 2021-08-08 MED ORDER — LACTATED RINGERS IV SOLN: INTRAVENOUS | Status: DC | PRN

## 2021-08-08 MED ORDER — OXYCODONE HCL 5 MG PO TABS: 5.0000 mg | ORAL_TABLET | ORAL | Status: DC | PRN

## 2021-08-08 MED ORDER — METHYLERGONOVINE MALEATE 0.2 MG PO TABS: 0.2000 mg | ORAL_TABLET | ORAL | Status: DC | PRN

## 2021-08-08 MED ORDER — ONDANSETRON HCL 4 MG PO TABS: 4.0000 mg | ORAL_TABLET | ORAL | Status: DC | PRN

## 2021-08-08 SURGICAL SUPPLY — 12 items
ADAPTER VACURETTE TBG SET 14 (CANNULA) ×2 IMPLANT
GLOVE SURG POLYISO LF SZ7 (GLOVE) ×2 IMPLANT
GLOVE SURG UNDER POLY LF SZ6.5 (GLOVE) ×6 IMPLANT
GOWN STRL REUS W/ TWL LRG LVL3 (GOWN DISPOSABLE) ×1 IMPLANT
GOWN STRL REUS W/TWL LRG LVL3 (GOWN DISPOSABLE) ×2
HIBICLENS CHG 4% 4OZ BTL (MISCELLANEOUS) ×2 IMPLANT
PACK VAGINAL MINOR WOMEN LF (CUSTOM PROCEDURE TRAY) ×2 IMPLANT
PAD OB MATERNITY 4.3X12.25 (PERSONAL CARE ITEMS) ×2 IMPLANT
PAD PREP 24X48 CUFFED NSTRL (MISCELLANEOUS) ×2 IMPLANT
SET BERKELEY SUCTION TUBING (SUCTIONS) ×4 IMPLANT
TOWEL OR 17X24 6PK STRL BLUE (TOWEL DISPOSABLE) ×2 IMPLANT
VACURETTE 12 RIGID CVD (CANNULA) ×2 IMPLANT

## 2021-08-08 NOTE — Progress Notes (Addendum)
Subjective:    Comfortable with epidural.   Objective:    VS: BP 98/68   Pulse 93   Temp 98.2 F (36.8 C) (Oral)   Resp 16   LMP 10/26/2020  FHR : baseline 140 / variability moderate / accelerations present / variable decelerations Toco: contractions every 4-6 minutes /  Membranes: AROM, cleat Dilation: 6 Effacement (%): 80 Cervical Position: Posterior Station: -1 Presentation: Vertex Exam by:: Sabah Zucco, CNM   Assessment/Plan:   20 y.o. [name] [redacted]w[redacted]d AROM, clear fluid  Labor: Progressing normally Fetal Wellbeing:  Category I Pain Control:  Epidural I/D:   GBS negative Anticipated MOD:  NSVD  Oley Balm MSN, CNM 08/08/2021 1:36 PM

## 2021-08-08 NOTE — Progress Notes (Signed)
Beverly Ramos is a 20 y.o. [name] at [redacted]w[redacted]d admitted for spontaneous labor  Subjective: Uncomfortable with contractions. Desires epidural  Objective: BP 129/77 (BP Location: Right Arm)   Pulse 96   Temp 98 F (36.7 C) (Oral)   Resp 16   LMP 10/26/2020  No intake/output data recorded. No intake/output data recorded.  FHT:  FHR: 135 bpm, variability: moderate,  accelerations:  Present,  decelerations:  Absent UC:   regular, every 4 minutes SVE:   Dilation: 4.5 Effacement (%): 80 Station: -1 Exam by:: Oley Balm, CNM  Labs: Lab Results  Component Value Date   WBC 9.4 08/08/2021   HGB 13.6 08/08/2021   HCT 39.8 08/08/2021   MCV 90.2 08/08/2021   PLT 167 08/08/2021    Assessment / Plan: Spontaneous labor. Minimal cervical change over 4 hours.   Labor:  Fetal Wellbeing:  Category I Pain Control:  Labor support without medications. Will get epidural  I/D:  GBS negative Plan to AROM after epidural. Risks of AROM explained. Patient in agreement.  Anticipated MOD:  NSVD  Dr. Normand Sloop notified of plan.   Oley Balm MSN, CNM 08/08/2021, 12:22 PM

## 2021-08-08 NOTE — Progress Notes (Signed)
Called to bedside for evaluation of clots with fundal massage and uterine atony.  S:  Pt feels blood trickling from vagina.   O: 08/08/21 1756 -- 93 -- -- 110/67 -- 100 % -- -- -- -- AW  08/08/21 1751 -- 90 -- -- 122/51 Abnormal  -- 100 % -- -- -- -- AW  08/08/21 1748 -- 90 -- -- 120/50 Abnormal  -- -- -- -- -- -- AW  08/08/21 1746 -- 93 -- -- 125/65 -- 100 % -- -- -- -- AW  08/08/21 1741 -- -- -- -- -- -- 100 % -- -- -- -- AW  08/08/21 1736 -- -- -- -- -- -- 99 % -- -- -- -- AW  08/08/21 1731 -- 88 -- -- 107/68 -- 99 % -- -- -- -- AW                  Fundal exam: atony w/ clots U/S: Clots in fundus, removed manually, retained placenta was not identified Jada placed in uterus, cervical seal filled with 120 cc sterile fluid and connected to wall suction at 80 mmHg  Jada catheter assessed after 1 hr, suction dc'd, cervical seal remains in place. Constant trickle of blood noted in the absence of suction. Dr. Richardson Dopp en route for assessment of continued bleeding.   Medications:  IV Pitocin  Methergine 0.2 mg IM TXA 1G IV Cytotec-400 mcg buccal and 600 mcg rectal Hemabate 250 mg IM  A/P: PPH    -Jada inserted x1 hr, bleeding has not resolved    -QBL 1265 ml     -Possible retained products Repeat CBC collected    -Hgb 12.3  Dr. Richardson Dopp updated and en route

## 2021-08-08 NOTE — Transfer of Care (Signed)
Immediate Anesthesia Transfer of Care Note  Patient: Beverly Ramos  Procedure(s) Performed: DILATATION AND CURETTAGE  Patient Location: PACU  Anesthesia Type:Epidural  Level of Consciousness: awake, alert  and patient cooperative  Airway & Oxygen Therapy: Patient Spontanous Breathing  Post-op Assessment: Report given to RN and Post -op Vital signs reviewed and stable  Post vital signs: Reviewed and stable  Last Vitals:  Vitals Value Taken Time  BP 120/73 08/08/21 2107  Temp    Pulse 87 08/08/21 2110  Resp 18 08/08/21 2110  SpO2 100 % 08/08/21 2110  Vitals shown include unvalidated device data.  Last Pain:  Vitals:   08/08/21 1850  TempSrc: Oral  PainSc:          Complications: No notable events documented.

## 2021-08-08 NOTE — Progress Notes (Signed)
Jada removed at [facility]

## 2021-08-08 NOTE — Anesthesia Preprocedure Evaluation (Signed)
Anesthesia Evaluation  Patient identified by MRN, date of birth, ID band Patient awake    Reviewed: Allergy & Precautions, H&P , NPO status , Patient's Chart, lab work & pertinent test results  Airway Mallampati: II  TM Distance: >3 FB Neck ROM: full    Dental no notable dental hx. (+) Teeth Intact   Pulmonary neg pulmonary ROS,    Pulmonary exam normal breath sounds clear to auscultation       Cardiovascular negative cardio ROS Normal cardiovascular exam Rhythm:regular Rate:Normal     Neuro/Psych negative neurological ROS  negative psych ROS   GI/Hepatic negative GI ROS, Neg liver ROS,   Endo/Other  negative endocrine ROS  Renal/GU negative Renal ROS  negative genitourinary   Musculoskeletal negative musculoskeletal ROS (+)   Abdominal   Peds negative pediatric ROS (+)  Hematology negative hematology ROS (+)   Anesthesia Other Findings   Reproductive/Obstetrics (+) Pregnancy                             Anesthesia Physical  Anesthesia Plan  ASA: 2 and emergent  Anesthesia Plan: Epidural   Post-op Pain Management:    Induction:   PONV Risk Score and Plan: 3 and Treatment may vary due to age or medical condition and Ondansetron  Airway Management Planned: Natural Airway  Additional Equipment: None  Intra-op Plan:   Post-operative Plan:   Informed Consent: I have reviewed the patients History and Physical, chart, labs and discussed the procedure including the risks, benefits and alternatives for the proposed anesthesia with the patient or authorized representative who has indicated his/her understanding and acceptance.       Plan Discussed with: Anesthesiologist, Surgeon and CRNA  Anesthesia Plan Comments: (Pt for post partum D&C under epidural )        Anesthesia Quick Evaluation

## 2021-08-08 NOTE — MAU Note (Addendum)
Patient presented to [facility] with CTX since 2000 yesterday, with a pain rating of 9/10 every 5 mins. Pt had membrance swept in office 2 day ago. Pt report SVE in office was 3 cm. Pt reports losing her mucous plug. Pt denies LOF, DFM, VB, and PIH s/s. Pt has an IOL scheduled for tomorrow 10/8.

## 2021-08-08 NOTE — Anesthesia Preprocedure Evaluation (Signed)
Anesthesia Evaluation  Patient identified by MRN, date of birth, ID band Patient awake    Reviewed: Allergy & Precautions, H&P , NPO status , Patient's Chart, lab work & pertinent test results  Airway Mallampati: II  TM Distance: >3 FB Neck ROM: full    Dental no notable dental hx.    Pulmonary neg pulmonary ROS,    Pulmonary exam normal breath sounds clear to auscultation       Cardiovascular negative cardio ROS Normal cardiovascular exam Rhythm:regular Rate:Normal     Neuro/Psych negative neurological ROS  negative psych ROS   GI/Hepatic negative GI ROS, Neg liver ROS,   Endo/Other  negative endocrine ROS  Renal/GU negative Renal ROS  negative genitourinary   Musculoskeletal negative musculoskeletal ROS (+)   Abdominal   Peds negative pediatric ROS (+)  Hematology negative hematology ROS (+)   Anesthesia Other Findings   Reproductive/Obstetrics (+) Pregnancy                             Anesthesia Physical  Anesthesia Plan  ASA: 2  Anesthesia Plan: Epidural   Post-op Pain Management:    Induction: Intravenous  PONV Risk Score and Plan: 1 and Treatment may vary due to age or medical condition  Airway Management Planned: Natural Airway  Additional Equipment:   Intra-op Plan:   Post-operative Plan:   Informed Consent: I have reviewed the patients History and Physical, chart, labs and discussed the procedure including the risks, benefits and alternatives for the proposed anesthesia with the patient or authorized representative who has indicated his/her understanding and acceptance.       Plan Discussed with: Anesthesiologist, Surgeon and CRNA  Anesthesia Plan Comments:         Anesthesia Quick Evaluation

## 2021-08-08 NOTE — Op Note (Signed)
08/08/2021  9:01 PM  PATIENT:  Beverly Ramos  20 y.o. female  PRE-OPERATIVE DIAGNOSIS:  Dilatation & Curettage  POST-OPERATIVE DIAGNOSIS:  Dilation & Curettage-  PROCEDURE:  Procedure(s): DILATATION AND CURETTAGE (N/A) under ultrasound guidance   SURGEON:  Surgeon(s) and Role:    Gerald Leitz, MD - Primary  PHYSICIAN ASSISTANT:   ASSISTANTS: Rhea Pink CNMW assisted    ANESTHESIA:   epidural  EBL:  300 mL   BLOOD ADMINISTERED:none  DRAINS: Urinary Catheter (Foley)   LOCAL MEDICATIONS USED:  NONE  SPECIMEN:  Source of Specimen:  retained products of conception   DISPOSITION OF SPECIMEN:  PATHOLOGY  COUNTS:  YES  TOURNIQUET:  * No tourniquets in log *  DICTATION: .Note written in EPIC  PLAN OF CARE: Admit to inpatient   PATIENT DISPOSITION:  PACU - hemodynamically stable.   Delay start of Pharmacological VTE agent (>24hrs) due to surgical blood loss or risk of bleeding: not applicable  Findings; Thickened endometrial stripe on ultrasound. 9.5 mm in the lower uterine segment and 5 mm fundally.  No vaginal lacerations were appreciated. No cervical lacerations were appreciated.   Procedure: Informed consent was obtained from the patient. She was taken to the OR C. Her epidural was re-dosed. She was placed in the dorsal lithotomy position. Prepped and draped in sterile fashion. A speculum was placed in the vaginal vault.  The anterior lip of the cervix was grasped with ring forceps. A large banjo curette was used to remove the blood and retained products. The 12 mm suction tubing was used as well. The endometrial striped appeared thin at the end of the procedure. All instruments were removed from her vagina.  Sponge / lap / and instrument counts were correct times 2. She was taken to the recovery room awake and in stable condition.

## 2021-08-08 NOTE — Anesthesia Procedure Notes (Signed)
Epidural Patient location during procedure: OB Start time: 08/08/2021 12:25 PM End time: 08/08/2021 12:39 PM  Staffing Anesthesiologist: Lowella Curb, MD Performed: anesthesiologist   Preanesthetic Checklist Completed: patient identified, IV checked, site marked, risks and benefits discussed, surgical consent, monitors and equipment checked, pre-op evaluation and timeout performed  Epidural Patient position: sitting Prep: ChloraPrep Patient monitoring: heart rate, cardiac monitor, continuous pulse ox and blood pressure Approach: midline Location: L2-L3 Injection technique: LOR saline  Needle:  Needle type: Tuohy  Needle gauge: 17 G Needle length: 9 cm Needle insertion depth: 7 cm Catheter type: closed end flexible Catheter size: 20 Guage Catheter at skin depth: 11 cm Test dose: negative  Assessment Events: blood not aspirated, injection not painful, no injection resistance, no paresthesia and negative IV test  Additional Notes Reason for block:procedure for pain

## 2021-08-08 NOTE — H&P (Signed)
OB ADMISSION/ HISTORY & PHYSICAL:  Admission Date: 08/08/2021  7:49 AM  Admit Diagnosis: Normal labor  Beverly Ramos is a 20 y.o. female [name] [redacted]w[redacted]d presenting for labor. Endorses active FM, denies LOF and vaginal bleeding. Ctx began @ 2000 on 08/07/21. Seen in the office on 08/06/2021 where she was 2/40/-2 and was swept. History of PPH in last birth managed with pitocin and methergine.   History of current pregnancy: [name]   Primary OB Provider: [facility] Patient entered care with [facility] at 10+4 wks.   EDC 08/02/2021 by LMP on 10/26/2020 and congruent w/ 7+1 wk U/S.   Anatomy scan:  21+6 wks, complete w/ posterior placenta.   Last evaluation: 40+5  wks  Significant prenatal events: History of asymptomatic bacteriuria and bilateral calculus of the kidney this pregnancy. Followed by urology and on daily cephalexin 500mg  for UTI suppression.   Patient Active Problem List   Diagnosis Date Noted   Normal labor 08/08/2021   History of kidney stones 08/08/2021   Bacteriuria, asymptomatic in pregnancy 08/08/2021   MRSA colonization 11/09/2018    Prenatal Labs: ABO, Rh: A/Positive/-- (03/10 0000) Antibody: Negative (03/10 0000) Rubella: Immune (03/10 0000)  RPR: Nonreactive (03/10 0000)  HBsAg: Negative (03/10 0000)  HIV: Non-reactive (03/10 0000)  GTT: 90 GBS: Negative/-- (09/10 0000)  GC/CHL: negative Genetics: low risk Tdap/influenza vaccines: declined prenatally   OB History  Gravida Para Term Preterm AB Living  2 1 1     1   SAB IAB Ectopic Multiple Live Births        0 1    # Outcome Date GA Lbr Len/2nd Weight Sex Delivery Anes PTL Lv  2 Current           1 Term 05/25/18 [redacted]w[redacted]d 02:15 / 03:07 4145 g M Vag-Spont EPI  LIV    Medical / Surgical History: Past medical history:  Past Medical History:  Diagnosis Date   Anemia    Ankle fracture    Right   Arm fracture, left    History of postpartum hemorrhage    UTI (urinary tract infection)     Past surgical history:  Past  Surgical History:  Procedure Laterality Date   NO PAST SURGERIES     Family History: History reviewed. No pertinent family history.  Social History:  reports that she has never smoked. She has never used smokeless tobacco. She reports that she does not drink alcohol and does not use drugs.  Allergies: Patient has no known allergies.   Current Medications at time of admission:  Prior to Admission medications   Medication Sig Start Date End Date Taking? Authorizing Provider  Prenatal Vit-Fe Fumarate-FA (PRENATAL MULTIVITAMIN) TABS tablet Take 1 tablet by mouth daily at 12 noon.   Yes [provider]  cephALEXin (KEFLEX) 500 MG capsule Take 500 mg by mouth 4 (four) times daily.    [provider]  famotidine (PEPCID) 40 MG tablet Take 1 tablet (40 mg total) by mouth every evening. 01/11/21 02/10/21  03/13/21, NP  phenazopyridine (PYRIDIUM) 200 MG tablet Take 200 mg by mouth 3 (three) times daily. 06/13/21   [provider]    Review of Systems: Constitutional: Negative   HENT: Negative   Eyes: Negative   Respiratory: Negative   Cardiovascular: Negative   Gastrointestinal: Negative  Genitourinary: positive for bloody show, negative for LOF   Musculoskeletal: Negative   Skin: Negative   Neurological: Negative   Endo/Heme/Allergies: Negative   Psychiatric/Behavioral: Negative    Physical  Exam: VS: Blood pressure 127/80, pulse 98, temperature 98.4 F (36.9 C), resp. rate 18, last menstrual period 10/26/2020, unknown if currently breastfeeding. AAO x3, no signs of distress Cardiovascular: RRR Respiratory: Lung fields clear to ausculation GU/GI: Abdomen gravid, non-tender, non-distended, active FM, vertex, EFW 5lb 7oz on [place] at 33+6.  Extremities: no edema, negative for pain, tenderness, and cords  Cervical exam:Dilation: 4 Effacement (%): 80 Station: -1 Exam by:: F. Morris, RNC FHR: baseline rate 135 / variability moderate / accelerations present /  absent  decelerations TOCO: 6-7 minutes   Prenatal Transfer Tool  Maternal Diabetes: No Genetic Screening: Normal Maternal Ultrasounds/Referrals: Urology for kidney stones.  Fetal Ultrasounds or other Referrals:  None Maternal Substance Abuse:  No Significant Maternal Medications:  None Significant Maternal Lab Results: Group B Strep negative    Assessment: 20 y.o. [name] [redacted]w[redacted]d  Latent stage of labor FHR category 1 GBS negative Pain management plan: Epidural   Plan:  Admit to L&D Routine admission orders Epidural PRN Anticipate NSVD  Dr Normand Sloop notified of admission and plan of care  Oley Balm MSN, CNM 08/08/2021 8:42 AM

## 2021-08-08 NOTE — Progress Notes (Signed)
In to assess patient due to continued postpartum bleeding despite jada placement. Per nurse and cmw Rhea Pink  400 cc additional around the [place]. Upon removal of the [place]. Approximately 500 cc of blood and clots removed... exam difficult in the room. Pt to be taken to OR for D&C  and exam under anesthesia. D/w pt r/b/a of surgery including but not limited to infection bleeding, possible perforation of the uterus. Possible Uterine Artery Embolization. Possible Hysterectomy. She voiced understanding and agrees to proceed.

## 2021-08-08 NOTE — MAU Note (Signed)
Pt reports she felt a little wet,leaking this morning . When she went to the BR a large "chunk" of stuff came out with some blood on it. Also c/o increase in ctx. More intense and closer together. Was in MAU last night 2-3 cm dilated.

## 2021-08-09 ENCOUNTER — Encounter (HOSPITAL_COMMUNITY): Payer: Self-pay | Admitting: Obstetrics and Gynecology

## 2021-08-09 ENCOUNTER — Inpatient Hospital Stay (HOSPITAL_COMMUNITY): Payer: BC Managed Care – PPO

## 2021-08-09 ENCOUNTER — Inpatient Hospital Stay (HOSPITAL_COMMUNITY)
Admission: AD | Admit: 2021-08-09 | Payer: BC Managed Care – PPO | Source: Home / Self Care | Admitting: Obstetrics and Gynecology

## 2021-08-09 LAB — CBC
HCT: 25.1 % — ABNORMAL LOW (ref 36.0–46.0)
Hemoglobin: 8.4 g/dL — ABNORMAL LOW (ref 12.0–15.0)
MCH: 30.4 pg (ref 26.0–34.0)
MCHC: 33.5 g/dL (ref 30.0–36.0)
MCV: 90.9 fL (ref 80.0–100.0)
Platelets: 125 10*3/uL — ABNORMAL LOW (ref 150–400)
RBC: 2.76 MIL/uL — ABNORMAL LOW (ref 3.87–5.11)
RDW: 13.6 % (ref 11.5–15.5)
WBC: 9.5 10*3/uL (ref 4.0–10.5)
nRBC: 0 % (ref 0.0–0.2)

## 2021-08-09 LAB — RPR: RPR Ser Ql: NONREACTIVE

## 2021-08-09 MED ORDER — SODIUM CHLORIDE 0.9 % IV SOLN: 500.0000 mg | Freq: Once | INTRAVENOUS | Status: DC

## 2021-08-09 MED ORDER — POLYSACCHARIDE IRON COMPLEX 150 MG PO CAPS
150.0000 mg | ORAL_CAPSULE | Freq: Every day | ORAL | Status: DC
  Administered 2021-08-09 – 2021-08-10 (×2): 150 mg via ORAL
  Filled 2021-08-09 (×2): qty 1

## 2021-08-09 MED ORDER — SODIUM CHLORIDE 0.9 % IV SOLN
500.0000 mg | Freq: Once | INTRAVENOUS | Status: AC
  Administered 2021-08-09: 500 mg via INTRAVENOUS
  Filled 2021-08-09: qty 25

## 2021-08-09 MED ORDER — MAGNESIUM OXIDE -MG SUPPLEMENT 400 (240 MG) MG PO TABS
400.0000 mg | ORAL_TABLET | Freq: Every day | ORAL | Status: DC
  Administered 2021-08-09 – 2021-08-10 (×2): 400 mg via ORAL
  Filled 2021-08-09 (×2): qty 1

## 2021-08-09 MED ORDER — SODIUM CHLORIDE 0.9 % IV SOLN
3.0000 g | Freq: Four times a day (QID) | INTRAVENOUS | Status: DC
  Administered 2021-08-10: 3 g via INTRAVENOUS
  Filled 2021-08-09 (×3): qty 8

## 2021-08-09 NOTE — Anesthesia Postprocedure Evaluation (Signed)
Anesthesia Post Note  Patient: Camari Quintanilla  Procedure(s) Performed: DILATATION AND CURETTAGE     Patient location during evaluation: L&D Anesthesia Type: Epidural Level of consciousness: awake and alert Pain management: pain level controlled Vital Signs Assessment: post-procedure vital signs reviewed and stable Respiratory status: spontaneous breathing, nonlabored ventilation, respiratory function stable and patient connected to nasal cannula oxygen Cardiovascular status: blood pressure returned to baseline and stable Postop Assessment: no apparent nausea or vomiting Anesthetic complications: no   No notable events documented.  Last Vitals:  Vitals:   08/08/21 2356 08/09/21 0430  BP: 113/69 107/71  Pulse: 99 87  Resp: 18 18  Temp: 36.9 C 36.5 C  SpO2: 100% 100%    Last Pain:  Vitals:   08/09/21 0430  TempSrc: Oral  PainSc: 6    Pain Goal: Patients Stated Pain Goal: 3 (08/09/21 0430)                 Trevor Iha

## 2021-08-09 NOTE — Anesthesia Postprocedure Evaluation (Signed)
Anesthesia Post Note  Patient: Beverly Ramos  Procedure(s) Performed: AN AD HOC LABOR EPIDURAL     Patient location during evaluation: Mother Baby Anesthesia Type: Epidural Level of consciousness: awake and alert Pain management: pain level controlled Vital Signs Assessment: post-procedure vital signs reviewed and stable Respiratory status: spontaneous breathing Cardiovascular status: stable Postop Assessment: no headache, adequate PO intake, no backache, patient able to bend at knees, able to ambulate, epidural receding and no apparent nausea or vomiting Anesthetic complications: no   No notable events documented.  Last Vitals:  Vitals:   08/08/21 2356 08/09/21 0430  BP: 113/69 107/71  Pulse: 99 87  Resp: 18 18  Temp: 36.9 C 36.5 C  SpO2: 100% 100%    Last Pain:  Vitals:   08/09/21 0430  TempSrc: Oral  PainSc: 6    Pain Goal: Patients Stated Pain Goal: 3 (08/09/21 0430)                 Salome Arnt

## 2021-08-09 NOTE — Progress Notes (Signed)
This RN attempted to do orthostatic vitals and standing trial; pt reports fatigue and lightheadedness and requests additional rest. MOB OK with another attempt after another nap and breakfast.   Elvia Collum, RN 08/09/21

## 2021-08-09 NOTE — Progress Notes (Signed)
PPD# 1 SVD w/ PPH and D&C Information for the patient's newborn:  Beverly Ramos, Beverly Ramos [[id number]]  female   Baby Name [facility] Circumcision N/A   S:   Reports feeling very tired. Foley cath remains in place, pt has not been out of bed since delivery. Discussed performing orthostatic BP and assisted ambulation this afternoon. Currently tolerating PO fluid and solids and denies nausea or vomiting.  Bleeding is spotting, no clots Pain controlled with  PO meds Up ad lib / ambulatory / voiding w/o difficulty Feeding: Breast    O:   VS: BP 113/66 (BP Location: Left Arm)   Pulse 100   Temp 99.3 F (37.4 C) (Oral)   Resp 16   LMP 10/26/2020   SpO2 98%   Breastfeeding Unknown   LABS:  Recent Labs    08/08/21 1810 08/09/21 0517  WBC 14.4* 9.5  HGB 12.3 8.4*  PLT 175 125*   Blood type: --/--/A POS (10/07 0845) Rubella: Immune (03/10 0000)                      I&O: Intake/Output      10/07 0701 10/08 0700 10/08 0701 10/09 0700   I.V. 1784.7    Other 65    IV Piggyback 276.3    Total Intake 2125.9    Urine 2000    Blood 2707    Total Output 4707    Net -2581.1           Physical Exam: Alert and oriented X3 Lungs: Clear and unlabored Heart: regular rate and rhythm / no mumurs Abdomen: soft, non-tender, non-distended  Fundus: firm, non-tender, U -3 Perineum: intact, edematous Lochia: scant Extremities: trace edema, no calf pain, tenderness, or cords    A:  PPD # 1  S/P PPH w/ D&C    -QBL 2707 Acute blood loss anemia  P:  Routine post partum orders Continue PO methergine and cytotec x24 hrs (last doses today @ ~1800 Continue Unasyn 3G x24 hrs Start IV iron infusion x1 dose and oral iron replacement therapy Remove foley if orthostatics BPs are stable Anticipate D/C on tomorrow   Plan reviewed w/ Dr. Kelvin Cellar, MSN, CNM 08/09/2021, 9:02 AM

## 2021-08-10 ENCOUNTER — Encounter (HOSPITAL_COMMUNITY): Payer: Self-pay | Admitting: Obstetrics and Gynecology

## 2021-08-10 DIAGNOSIS — D62 Acute posthemorrhagic anemia: Secondary | ICD-10-CM | POA: Diagnosis not present

## 2021-08-10 MED ORDER — IBUPROFEN 600 MG PO TABS: 600.0000 mg | ORAL_TABLET | Freq: Four times a day (QID) | ORAL | 0 refills | Status: AC

## 2021-08-10 MED ORDER — POLYSACCHARIDE IRON COMPLEX 150 MG PO CAPS: 150.0000 mg | ORAL_CAPSULE | Freq: Every day | ORAL | 3 refills | Status: AC

## 2021-08-10 NOTE — Discharge Summary (Signed)
SVD OB Discharge Summary     Patient Name: Beverly Ramos DOB: May 03, 2001 MRN: [id number]  Date of admission: 08/08/2021 Delivering MD: Oley Balm  Date of delivery: 08/08/2021 Type of delivery: SVD  Newborn Data: Sex: Baby female Live born female  Birth Weight: 7 lb 10.8 oz (3480 g) APGAR: 9, 9  Newborn Delivery   Birth date/time: 08/08/2021 16:34:00 Delivery type: Vaginal, Spontaneous      Feeding: breast Infant being discharge to home with mother in stable condition.   Admitting diagnosis: Normal labor [O80, Z37.9] SVD (spontaneous vaginal delivery) [O80] Intrauterine pregnancy: [redacted]w[redacted]d     Secondary diagnosis:  Active Problems:   Normal labor   History of kidney stones   Bacteriuria, asymptomatic in pregnancy   SVD (spontaneous vaginal delivery)   PPH (postpartum hemorrhage)   S/P dilatation and curettage   Acute blood loss anemia                                Complications: Hemorrhage>1092mL and PP D&C                                                              Intrapartum Procedures: spontaneous vaginal delivery Postpartum Procedures: transfusion IV Venofer x1 and curettage Complications-Operative and Postpartum:  none degree perineal laceration Augmentation: AROM   History of Present Illness: [name] is a 20 y.o. female, G2P2002, who presents at [redacted]w[redacted]d weeks gestation. The patient has been followed at  [facility] and Gynecology  Her pregnancy has been complicated by:  Patient Active Problem List   Diagnosis Date Noted   Acute blood loss anemia 08/10/2021   Normal labor 08/08/2021   History of kidney stones 08/08/2021   Bacteriuria, asymptomatic in pregnancy 08/08/2021   SVD (spontaneous vaginal delivery) 08/08/2021   PPH (postpartum hemorrhage) 08/08/2021   S/P dilatation and curettage 08/08/2021   MRSA colonization 11/09/2018     Active Ambulatory Problems    Diagnosis Date Noted   MRSA colonization  11/09/2018   Resolved Ambulatory Problems    Diagnosis Date Noted   Delayed delivery after SROM (spontaneous rupture of membranes) 05/25/2018   Normal postpartum course 05/27/2018   Migraine 03/25/2021   Irregular contractions 08/08/2021   Past Medical History:  Diagnosis Date   Anemia    Ankle fracture    Arm fracture, left    History of postpartum hemorrhage    UTI (urinary tract infection)      Hospital course:  Onset of Labor With Vaginal Delivery      20 y.o. yo D1V6160 at [redacted]w[redacted]d was admitted in Latent Labor on 08/08/2021. Patient had an uncomplicated labor course as follows:  Membrane Rupture Time/Date: 1:31 PM ,08/08/2021   Delivery Method:Vaginal, Spontaneous  Episiotomy: None  Lacerations:  None  Patient had an uncomplicated postpartum course.  She is ambulating, tolerating a regular diet, passing flatus, and urinating well. Patient is discharged home in stable condition on 08/10/21.  Newborn Data: Birth date:08/08/2021  Birth time:4:34 PM  Gender:Female  Living status:Living  Apgars:9 ,9  Weight:3480 g  Postpartum Day # 2 : S/P NSVD due to pt 10/7 for latent labor at 4 cm at 40.6 weeks, pt progress with AROM, had SVD on  10/7 @ 1634 with EBL of 400 over intact perineum, hgb drop of .13.6-12.3 then had PPH was given TXA, pitocin, methergine, Hemabate, and Jada was applied after 1 hour bleeding still noted then was taken to OR for D&C with Dr Richardson Dopp, total EBL was 2702, hgb dropped to 8.4, received IV venofer 10/8 and today on PO iron, asymptomatic, pt also s/p 24 H abx, afebrile. Patient up ad lib, denies syncope or dizziness. Reports consuming regular diet without issues and denies N/V. Patient reports 1 bowel movement + passing flatus.  Denies issues with urination and reports bleeding is "lighter."  Patient is breastfeeding and reports going well.  Desires either IUD or vasectomy for postpartum contraception.  Pain is being appropriately managed with use of po meds.   Physical  exam  Vitals:   08/09/21 1750 08/09/21 2146 08/10/21 0618 08/10/21 0920  BP:  119/72 100/64 (P) 119/81  Pulse:  95 72 (P) 72  Resp:  17 18 (P) 20  Temp: 99.3 F (37.4 C) 99 F (37.2 C) 98.1 F (36.7 C)   TempSrc: Oral Oral Oral   SpO2:  98%     General: alert, cooperative, and no distress Lochia: appropriate Uterine Fundus: firm Perineum: intact DVT Evaluation: No evidence of DVT seen on physical exam. Negative Homan's sign. No cords or calf tenderness. No significant calf/ankle edema.  Labs: Lab Results  Component Value Date   WBC 9.5 08/09/2021   HGB 8.4 (L) 08/09/2021   HCT 25.1 (L) 08/09/2021   MCV 90.9 08/09/2021   PLT 125 (L) 08/09/2021   No flowsheet data found.  Date of discharge: 08/10/2021 Discharge Diagnoses: Term Pregnancy-delivered and PPH, D&C Discharge instruction: per After Visit Summary and "Baby and Me Booklet".  After visit meds:   Activity:           unrestricted Advance as tolerated. Pelvic rest for 6 weeks.  Diet:                routine Medications: PNV, Ibuprofen, Colace, and Iron Postpartum contraception: IUD Mirena Condition:  Pt discharge to home with baby in stable Anemia: PO Iron  Meds: Allergies as of 08/10/2021   No Known Allergies      Medication List     STOP taking these medications    cephALEXin 500 MG capsule Commonly known as: KEFLEX   phenazopyridine 200 MG tablet Commonly known as: PYRIDIUM       TAKE these medications    famotidine 40 MG tablet Commonly known as: PEPCID Take 1 tablet (40 mg total) by mouth every evening.   ibuprofen 600 MG tablet Commonly known as: ADVIL Take 1 tablet (600 mg total) by mouth every 6 (six) hours.   iron polysaccharides 150 MG capsule Commonly known as: NIFEREX Take 1 capsule (150 mg total) by mouth daily. Start taking on: August 11, 2021   prenatal multivitamin Tabs tablet Take 1 tablet by mouth daily at 12 noon.        Discharge Follow Up:   Follow-up  Information     [facility]. Schedule an appointment as soon as possible for a visit in 6 week(s).   Specialty: Obstetrics and Gynecology Contact information: [address] [place] [address] [phone]                 [place], NP-C, CNM 08/10/2021, 11:39 AM  Dale Fridley, FNP

## 2021-08-11 LAB — BPAM RBC
Blood Product Expiration Date: 202210302359
Blood Product Expiration Date: 202210302359
ISSUE DATE / TIME: 202209301332
ISSUE DATE / TIME: 202209301332
Unit Type and Rh: 6200
Unit Type and Rh: 6200

## 2021-08-11 LAB — TYPE AND SCREEN
ABO/RH(D): A POS
Antibody Screen: NEGATIVE
Unit division: 0
Unit division: 0

## 2021-08-13 LAB — SURGICAL PATHOLOGY

## 2021-08-20 ENCOUNTER — Telehealth (HOSPITAL_COMMUNITY): Payer: Self-pay | Admitting: *Deleted

## 2021-08-20 NOTE — Telephone Encounter (Signed)
Hospital Discharge Follow-Up Call:  Patient reports that she is well and has no concerns about her healing process.  EPDS today was 0 and patient endorses this accurately reflects that she is doing well emotionally.  Patient says that baby is well and she has no concerns about baby's health at this time.  She reports that baby sleeps in a bassinet.  Reviewed ABCs of Safe Sleep.

## 2021-09-12 ENCOUNTER — Encounter: Payer: Self-pay | Admitting: *Deleted

## 2021-09-12 NOTE — Progress Notes (Signed)
  PATIENT NAVIGATOR PROGRESS NOTE  Name: Beverly Ramos Date: 09/12/2021 MRN: [id number]  DOB: February 06, 2001   Reason for visit:  Phone visit re: referral  Comments:  Spoke with Beverly Ramos regarding referral, she stated that after her delivery she experienced post partum bleeding and although the performed D&C resolved the issue the cause is not known. The issue was not retained placenta. She also experienced post partum hemorrhage with first delivery years ago. She would like to be evaluated for underlying bleeding disorder by hematologist. Will proceed with scheduling    Time spent counseling/coordinating care: 30-45 minutes

## 2021-09-15 ENCOUNTER — Telehealth: Payer: Self-pay | Admitting: Hematology

## 2021-09-15 NOTE — Telephone Encounter (Signed)
Scheduled appt per 11/7 referral. Pt is aware of appt date and time.  

## 2021-10-01 ENCOUNTER — Inpatient Hospital Stay: Payer: BC Managed Care – PPO | Attending: Hematology | Admitting: Hematology

## 2021-10-01 ENCOUNTER — Other Ambulatory Visit: Payer: Self-pay

## 2021-10-01 ENCOUNTER — Inpatient Hospital Stay: Payer: BC Managed Care – PPO

## 2021-10-01 DIAGNOSIS — Z87440 Personal history of urinary (tract) infections: Secondary | ICD-10-CM

## 2021-10-01 DIAGNOSIS — Z8759 Personal history of other complications of pregnancy, childbirth and the puerperium: Secondary | ICD-10-CM | POA: Diagnosis not present

## 2021-10-01 DIAGNOSIS — Z79899 Other long term (current) drug therapy: Secondary | ICD-10-CM | POA: Diagnosis not present

## 2021-10-01 LAB — PLATELET FUNCTION ASSAY: Collagen / Epinephrine: 114 seconds (ref 0–193)

## 2021-10-01 LAB — CBC WITH DIFFERENTIAL/PLATELET
Abs Immature Granulocytes: 0.01 10*3/uL (ref 0.00–0.07)
Basophils Absolute: 0 10*3/uL (ref 0.0–0.1)
Basophils Relative: 1 %
Eosinophils Absolute: 0.1 10*3/uL (ref 0.0–0.5)
Eosinophils Relative: 2 %
HCT: 38.2 % (ref 36.0–46.0)
Hemoglobin: 12.5 g/dL (ref 12.0–15.0)
Immature Granulocytes: 0 %
Lymphocytes Relative: 35 %
Lymphs Abs: 1.9 10*3/uL (ref 0.7–4.0)
MCH: 28 pg (ref 26.0–34.0)
MCHC: 32.7 g/dL (ref 30.0–36.0)
MCV: 85.5 fL (ref 80.0–100.0)
Monocytes Absolute: 0.3 10*3/uL (ref 0.1–1.0)
Monocytes Relative: 6 %
Neutro Abs: 3 10*3/uL (ref 1.7–7.7)
Neutrophils Relative %: 56 %
Platelets: 246 10*3/uL (ref 150–400)
RBC: 4.47 MIL/uL (ref 3.87–5.11)
RDW: 12.9 % (ref 11.5–15.5)
WBC: 5.4 10*3/uL (ref 4.0–10.5)
nRBC: 0 % (ref 0.0–0.2)

## 2021-10-01 LAB — CMP (CANCER CENTER ONLY)
ALT: 23 U/L (ref 0–44)
AST: 15 U/L (ref 15–41)
Albumin: 4.5 g/dL (ref 3.5–5.0)
Alkaline Phosphatase: 67 U/L (ref 38–126)
Anion gap: 11 (ref 5–15)
BUN: 10 mg/dL (ref 6–20)
CO2: 24 mmol/L (ref 22–32)
Calcium: 9 mg/dL (ref 8.9–10.3)
Chloride: 105 mmol/L (ref 98–111)
Creatinine: 0.8 mg/dL (ref 0.44–1.00)
GFR, Estimated: >60 mL/min (ref >60)
Glucose, Bld: 100 mg/dL — ABNORMAL HIGH (ref 70–99)
Potassium: 3.7 mmol/L (ref 3.5–5.1)
Sodium: 140 mmol/L (ref 135–145)
Total Bilirubin: 0.5 mg/dL (ref 0.3–1.2)
Total Protein: 7.8 g/dL (ref 6.5–8.1)

## 2021-10-01 LAB — PROTIME-INR
INR: 1 (ref 0.8–1.2)
Prothrombin Time: 13.2 seconds (ref 11.4–15.2)

## 2021-10-01 LAB — FIBRINOGEN: Fibrinogen: 220 mg/dL (ref 210–475)

## 2021-10-01 LAB — APTT: aPTT: 28 seconds (ref 24–36)

## 2021-10-01 NOTE — Progress Notes (Addendum)
HEMATOLOGY/ONCOLOGY CONSULTATION NOTE  Date of Service: 10/01/2021  Patient Care Team: Patient, No Pcp Per (Inactive) as PCP - General (General Practice)  CHIEF COMPLAINTS/PURPOSE OF CONSULTATION:   Excessive postpartum hemorrhage rule out bleeding disorder  HISTORY OF PRESENTING ILLNESS:   Beverly Ramos is a wonderful 20 y.o. female who has been referred to [place] by Arlyn Leak CNM for evaluation postpartum hemorrhage to rule out any bleeding disorder.  Is an overall healthy 20 yr old female with no history of chronic medical issues who recently gave birth to her second child 08/08/2021 spontaneous vaginal delivery at 103w6d. She notes that she had significant postpartum vaginal bleeding resulted in hemorrhage with EBL of 2700 ml per GYN reports Despite having received TXA, Pitocin , Methergine , Hemabate and use of a Jada device for 1 hour. She needed a D&C for control of hemorrhage which showed some retained products of conception. Her hemoglobin levels have dropped from 12.3 down to 8.4.  She did not receive/did not need any PRBC transfusion. She also received 1 dose of IV Venofer.  Patient notes she was having bloody lochia for about a week this has resolved. No reported perianal lacerations or excessive uterine injury during childbirth.  Patient reports that she she also had some excessive postpartum bleeding for her previous spontaneous vaginal delivery with her first child on 05/25/2018 with estimated blood loss of 1250 mL. She had a Pitocin augmented labor.  Received p.o. Methergine series for PPH.  Patient notes no family history of bleeding disorder. Previous history of spontaneous epistaxis, gum bleeding, intramuscular bleeding, intra-articular bleeding, CNS or GI bleeding. Notes no excessive bleeding after extraction of wisdom teeth.  Denies being on any other over-the-counter herbal supplements significant NSAID use. Denies excessively heavy periods between  pregnancies or previously.  No family history of bleeding or clotting disorders.   MEDICAL HISTORY:  Past Medical History:  Diagnosis Date   Anemia    Ankle fracture    Right   Arm fracture, left    History of postpartum hemorrhage    UTI (urinary tract infection)     SURGICAL HISTORY: Past Surgical History:  Procedure Laterality Date   DILATION AND CURETTAGE OF UTERUS N/A 08/08/2021   Procedure: DILATATION AND CURETTAGE;  Surgeon: Gerald Leitz, MD;  Location: [facility];  Service: Gynecology;  Laterality: N/A;   NO PAST SURGERIES      SOCIAL HISTORY: Social History   Socioeconomic History   Marital status: Married    Spouse name: Not on file   Number of children: Not on file   Years of education: Not on file   Highest education level: Not on file  Occupational History   Not on file  Tobacco Use   Smoking status: Never   Smokeless tobacco: Never  Vaping Use   Vaping Use: Never used  Substance and Sexual Activity   Alcohol use: Never   Drug use: Never   Sexual activity: Yes    Birth control/protection: None  Other Topics Concern   Not on file  Social History Narrative   Not on file   Social Determinants of Health   Financial Resource Strain: Not on file  Food Insecurity: Not on file  Transportation Needs: Not on file  Physical Activity: Not on file  Stress: Not on file  Social Connections: Not on file  Intimate Partner Violence: Not on file    FAMILY HISTORY: No family history of bleeding or clotting disorder  ALLERGIES:  has  No Known Allergies.  MEDICATIONS:  Current Outpatient Medications  Medication Sig Dispense Refill   famotidine (PEPCID) 40 MG tablet Take 1 tablet (40 mg total) by mouth every evening. 30 tablet 0   ibuprofen (ADVIL) 600 MG tablet Take 1 tablet (600 mg total) by mouth every 6 (six) hours. 30 tablet 0   iron polysaccharides (NIFEREX) 150 MG capsule Take 1 capsule (150 mg total) by mouth daily. 30 capsule 3   Prenatal Vit-Fe  Fumarate-FA (PRENATAL MULTIVITAMIN) TABS tablet Take 1 tablet by mouth daily at 12 noon.     No current facility-administered medications for this visit.    REVIEW OF SYSTEMS:    10 Point review of Systems was done is negative except as noted above.  PHYSICAL EXAMINATION: ECOG PERFORMANCE STATUS: 1  . Vitals:   10/01/21 1306  BP: 104/71  Pulse: 81  Resp: 20  Temp: 97.9 F (36.6 C)  SpO2: 99%   Filed Weights   10/01/21 1306  Weight: 198 lb 3.2 oz (89.9 kg)   .Body mass index is 31.04 kg/m.  GENERAL:alert, in no acute distress and comfortable SKIN: no acute rashes, no significant lesions EYES: conjunctiva are pink and non-injected, sclera anicteric OROPHARYNX: MMM, no exudates, no oropharyngeal erythema or ulceration NECK: supple, no JVD LYMPH:  no palpable lymphadenopathy in the cervical, axillary or inguinal regions LUNGS: clear to auscultation b/l with normal respiratory effort HEART: regular rate & rhythm ABDOMEN:  normoactive bowel sounds , non tender, not distended. Extremity: no pedal edema PSYCH: alert & oriented x 3 with fluent speech NEURO: no focal motor/sensory deficits  LABORATORY DATA:  I have reviewed the data as listed  . CBC Latest Ref Rng & Units 10/01/2021 08/09/2021 08/08/2021  WBC 4.0 - 10.5 K/uL 5.4 9.5 14.4(H)  Hemoglobin 12.0 - 15.0 g/dL 47.0 9.6(G) 83.6  Hematocrit 36.0 - 46.0 % 38.2 25.1(L) 36.7  Platelets 150 - 400 K/uL 246 125(L) 175    . CMP Latest Ref Rng & Units 10/01/2021  Glucose 70 - 99 mg/dL 629(U)  BUN 6 - 20 mg/dL 10  Creatinine 7.65 - 4.65 mg/dL 0.35  Sodium 465 - 681 mmol/L 140  Potassium 3.5 - 5.1 mmol/L 3.7  Chloride 98 - 111 mmol/L 105  CO2 22 - 32 mmol/L 24  Calcium 8.9 - 10.3 mg/dL 9.0  Total Protein 6.5 - 8.1 g/dL 7.8  Total Bilirubin 0.3 - 1.2 mg/dL 0.5  Alkaline Phos 38 - 126 U/L 67  AST 15 - 41 U/L 15  ALT 0 - 44 U/L 23     RADIOGRAPHIC STUDIES: I have personally reviewed the radiological images as  listed and agreed with the findings in the report. No results found.  ASSESSMENT & PLAN:   20 year old patient with  1) 2 episodes of heavy postpartum hemorrhage as detailed in HPI. No obvious labor related to trauma or significant retained products of conception. No reported uterine atony. PLAN -We discussed the several different etiologies of postpartum hemorrhage including local factors uterine atony, retained products of conception etc. -I performed comprehensive bleeding risk score assessment and patient does not have any symptoms suggestive of other sources of bleeding and normally problems with hemostatic challenges such as wisdom tooth extraction. -Patient reports no clear family history of bleeding disorder. -We discussed that since she has had excessive bleeding in addition to local risk factors that her OB/GYN doctor has assessed it would not be unreasonable to send out screening labs to rule out the presence of any possible  subclinical hemostatic disorder. -Patient did have prothrombin time, APTT and fibrinogen checked on 08/08/2021 during her episode of postpartum hemorrhage and these were noted to be within the normal range. -Lab work-up was sent today as noted below.  2) anemia related to acute blood loss -resolved on labs today -Reasonable to continue p.o. iron to target ferritin levels of more than 50.   Follow-up Labs today Phone visit with Dr Candise Che in 2 weeks  Orders Placed This Encounter  Procedures   CBC with Differential/Platelet    Standing Status:   Future    Number of Occurrences:   1    Standing Expiration Date:   10/01/2022   CMP (Cancer Center only)    Standing Status:   Future    Number of Occurrences:   1    Standing Expiration Date:   10/01/2022   APTT    Standing Status:   Future    Number of Occurrences:   1    Standing Expiration Date:   10/01/2022   Protime-INR    Standing Status:   Future    Number of Occurrences:   1    Standing Expiration  Date:   10/01/2022   Fibrinogen    Standing Status:   Future    Number of Occurrences:   1    Standing Expiration Date:   10/01/2022   Von Willebrand panel    Standing Status:   Future    Number of Occurrences:   1    Standing Expiration Date:   10/01/2022   Von Willebrand Factor Multimer    Standing Status:   Future    Number of Occurrences:   1    Standing Expiration Date:   10/01/2022   Platelet function assay    Standing Status:   Future    Number of Occurrences:   1    Standing Expiration Date:   10/01/2022   Factor 13 activity    Standing Status:   Future    Number of Occurrences:   1    Standing Expiration Date:   10/01/2022   Alpha-1-antitrypsin    Standing Status:   Future    Number of Occurrences:   1    Standing Expiration Date:   10/01/2022      All of the patients questions were answered with apparent satisfaction. The patient knows to call the clinic with any problems, questions or concerns.  I spent 32 mins counseling the patient face to face. The total time spent in the appointment was 47 mins and more than 50% was on counseling and direct patient cares.    Wyvonnia Lora MD MS AAHIVMS [facility] Hematology/Oncology Physician [facility]  (Office):       [phone] (Work cell):  [phone] (Fax):           [phone]  10/01/2021 1:21 PM

## 2021-10-02 LAB — VON WILLEBRAND PANEL
Coagulation Factor VIII: 103 % (ref 56–140)
Ristocetin Co-factor, Plasma: 73 % (ref 50–200)
Von Willebrand Antigen, Plasma: 90 % (ref 50–200)

## 2021-10-02 LAB — ALPHA-1-ANTITRYPSIN: A-1 Antitrypsin, Ser: 115 mg/dL (ref 100–188)

## 2021-10-02 LAB — COAG STUDIES INTERP REPORT

## 2021-10-03 LAB — FACTOR 13 ACTIVITY: Factor XIII, Qualitative: NORMAL

## 2021-10-08 LAB — VON WILLEBRAND FACTOR MULTIMER

## 2021-10-13 ENCOUNTER — Inpatient Hospital Stay: Payer: BC Managed Care – PPO | Attending: Hematology | Admitting: Hematology

## 2021-10-14 NOTE — Progress Notes (Signed)
HEMATOLOGY/ONCOLOGY PHONE VISIT NOTE  Date of Service: .10/13/2021   Patient Care Team: Patient, No Pcp Per (Inactive) as PCP - General (General Practice)  CHIEF COMPLAINTS/PURPOSE OF CONSULTATION:   Follow-up to discuss lab results from her work-up for hemostatic disorder  HISTORY OF PRESENTING ILLNESS:   Beverly Ramos is a wonderful 20 y.o. female who has been referred to us by Arlyn LeakVicki Legrand Latham CNM for evaluation postpartum hemorrhage to rule out any bleeding disorder.  Is an overall healthy 20 yr old female with no history of chronic medical issues who recently gave birth to her second child 08/08/2021 spontaneous vaginal delivery at 3049w6d. She notes that she had significant postpartum vaginal bleeding resulted in hemorrhage with EBL of 2700 ml per GYN reports Despite having received TXA, Pitocin , Methergine , Hemabate and use of a Jada device for 1 hour. She needed a D&C for control of hemorrhage which showed some retained products of conception. Her hemoglobin levels have dropped from 12.3 down to 8.4.  She did not receive/did not need any PRBC transfusion. She also received 1 dose of IV Venofer.  Patient notes she was having bloody lochia for about a week this has resolved. No reported perianal lacerations or excessive uterine injury during childbirth.  Patient reports that she she also had some excessive postpartum bleeding for her previous spontaneous vaginal delivery with her first child on 05/25/2018 with estimated blood loss of 1250 mL. She had a Pitocin augmented labor.  Received p.o. Methergine series for PPH.  Patient notes no family history of bleeding disorder. Previous history of spontaneous epistaxis, gum bleeding, intramuscular bleeding, intra-articular bleeding, CNS or GI bleeding. Notes no excessive bleeding after extraction of wisdom teeth.  Denies being on any other over-the-counter herbal supplements significant NSAID use. Denies excessively heavy  periods between pregnancies or previously.  No family history of bleeding or clotting disorders.  INTERVAL HISTORY  .I connected with Beverly GordonSophia Trettin on .10/13/2021 at  3:20 PM EST by telephone visit and verified that I am speaking with the correct person using two identifiers.   I discussed the limitations, risks, security and privacy concerns of performing an evaluation and management service by telemedicine and the availability of in-person appointments. I also discussed with the patient that there may be a patient responsible charge related to this service. The patient expressed understanding and agreed to proceed.   Other persons participating in the visit and their role in the encounter: none   Patients location: Home  [facility]  Chief Complaint: Follow-up to discuss hemostatic disorder lab work-up.  Patient notes no acute new concerns since her last clinic visit.  Her lab work-up done on 10/01/2021 to evaluate for hemostatic disorder shows no overt evidence of a bleeding disorder.  Results were discussed in detail with her. CBC shows hemoglobin levels have normalized since her last labs. Patient has no active symptoms suggestive of abnormal bleeding at this time.  No abnormal bruising.  No epistaxis.  No GI bleeding or hematuria.   MEDICAL HISTORY:  Past Medical History:  Diagnosis Date   Anemia    Ankle fracture    Right   Arm fracture, left    History of postpartum hemorrhage    UTI (urinary tract infection)     SURGICAL HISTORY: Past Surgical History:  Procedure Laterality Date   DILATION AND CURETTAGE OF UTERUS N/A 08/08/2021   Procedure: DILATATION AND CURETTAGE;  Surgeon: Gerald Leitzole, Tara, MD;  Location: [facility];  Service: Gynecology;  Laterality: N/A;   NO PAST SURGERIES      SOCIAL HISTORY: Social History   Socioeconomic History   Marital status: Married    Spouse name: Not on file   Number of children: Not on file   Years  of education: Not on file   Highest education level: Not on file  Occupational History   Not on file  Tobacco Use   Smoking status: Never   Smokeless tobacco: Never  Vaping Use   Vaping Use: Never used  Substance and Sexual Activity   Alcohol use: Never   Drug use: Never   Sexual activity: Yes    Birth control/protection: None  Other Topics Concern   Not on file  Social History Narrative   Not on file   Social Determinants of Health   Financial Resource Strain: Not on file  Food Insecurity: Not on file  Transportation Needs: Not on file  Physical Activity: Not on file  Stress: Not on file  Social Connections: Not on file  Intimate Partner Violence: Not on file    FAMILY HISTORY: No family history of bleeding or clotting disorder  ALLERGIES:  has No Known Allergies.  MEDICATIONS:  Current Outpatient Medications  Medication Sig Dispense Refill   famotidine (PEPCID) 40 MG tablet Take 1 tablet (40 mg total) by mouth every evening. 30 tablet 0   ibuprofen (ADVIL) 600 MG tablet Take 1 tablet (600 mg total) by mouth every 6 (six) hours. 30 tablet 0   iron polysaccharides (NIFEREX) 150 MG capsule Take 1 capsule (150 mg total) by mouth daily. 30 capsule 3   Prenatal Vit-Fe Fumarate-FA (PRENATAL MULTIVITAMIN) TABS tablet Take 1 tablet by mouth daily at 12 noon.     No current facility-administered medications for this visit.    REVIEW OF SYSTEMS:    Negative other than that noted above  PHYSICAL EXAMINATION: Telemedicine visit LABORATORY DATA:  I have reviewed the data as listed  . CBC Latest Ref Rng & Units 10/01/2021 08/09/2021 08/08/2021  WBC 4.0 - 10.5 K/uL 5.4 9.5 14.4(H)  Hemoglobin 12.0 - 15.0 g/dL 02.6 3.7(C) 58.8  Hematocrit 36.0 - 46.0 % 38.2 25.1(L) 36.7  Platelets 150 - 400 K/uL 246 125(L) 175    . CMP Latest Ref Rng & Units 10/01/2021  Glucose 70 - 99 mg/dL 502(D)  BUN 6 - 20 mg/dL 10  Creatinine 7.41 - 2.87 mg/dL 8.67  Sodium 672 - 094 mmol/L 140   Potassium 3.5 - 5.1 mmol/L 3.7  Chloride 98 - 111 mmol/L 105  CO2 22 - 32 mmol/L 24  Calcium 8.9 - 10.3 mg/dL 9.0  Total Protein 6.5 - 8.1 g/dL 7.8  Total Bilirubin 0.3 - 1.2 mg/dL 0.5  Alkaline Phos 38 - 126 U/L 67  AST 15 - 41 U/L 15  ALT 0 - 44 U/L 23   Component     Latest Ref Rng & Units 10/01/2021  Coagulation Factor VIII     56 - 140 % 103  Ristocetin Co-factor, Plasma     50 - 200 % 73  Von Willebrand Antigen, Plasma     50 - 200 % 90  Prothrombin Time     11.4 - 15.2 seconds 13.2  INR     0.8 - 1.2 1.0  PFA Interpretation        Collagen / Epinephrine     0 - 193 seconds 114  APTT     24 - 36 seconds 28  Fibrinogen  210 - 475 mg/dL 220  Von Willebrand Multimers      Comment  Factor XIII, Qualitative     No Lysis/24 NORMAL  A-1 Antitrypsin, Ser     100 - 188 mg/dL 115    RADIOGRAPHIC STUDIES: I have personally reviewed the radiological images as listed and agreed with the findings in the report. No results found.  ASSESSMENT & PLAN:   20 year old patient with  1) 2 Episodes of heavy postpartum hemorrhage as detailed in HPI. No obvious labor related to trauma or significant retained products of conception. No reported uterine atony.  PLAN -Lab work-up was sent out on 10/01/2021 and the results of these were discussed in detail today on the phone. -Patient has normal CBC with normal platelet counts, platelet function test within normal limits. -Prothrombin time and APTT within normal limits -Thyroid noted within normal limits Von Willebrand panel was within normal limits. Alpha 1 antitrypsin within normal limits Factor XIII qualitative testing within normal limits. Patient has no clear evidence of a hemostatic disorder. It is possible her postpartum hemorrhage occurred in the context of a dilutional or consumptive coagulopathy which has since corrected. Encouraged patient to eat enough green leafy vegetables and good sources of vitamin K  2)  anemia related to acute blood loss -resolved on labs today -Reasonable to continue p.o. iron to target ferritin levels of more than 50. -Follow-up on oral iron replacement with primary care physician    Follow-up return to clinic with primary care physician  No orders of the defined types were placed in this encounter.    Sullivan Lone MD [place] AAHIVMS [facility] Hematology/Oncology Physician [facility]

## 2022-08-25 ENCOUNTER — Ambulatory Visit (INDEPENDENT_AMBULATORY_CARE_PROVIDER_SITE_OTHER): Payer: BC Managed Care – PPO | Admitting: Plastic Surgery

## 2022-08-25 ENCOUNTER — Encounter: Payer: Self-pay | Admitting: Plastic Surgery

## 2022-08-25 DIAGNOSIS — M542 Cervicalgia: Secondary | ICD-10-CM | POA: Diagnosis not present

## 2022-08-25 DIAGNOSIS — M545 Low back pain, unspecified: Secondary | ICD-10-CM | POA: Diagnosis not present

## 2022-08-25 DIAGNOSIS — M546 Pain in thoracic spine: Secondary | ICD-10-CM

## 2022-08-25 DIAGNOSIS — M549 Dorsalgia, unspecified: Secondary | ICD-10-CM | POA: Insufficient documentation

## 2022-08-25 DIAGNOSIS — G8929 Other chronic pain: Secondary | ICD-10-CM

## 2022-08-25 DIAGNOSIS — R21 Rash and other nonspecific skin eruption: Secondary | ICD-10-CM

## 2022-08-25 DIAGNOSIS — M25511 Pain in right shoulder: Secondary | ICD-10-CM

## 2022-08-25 DIAGNOSIS — N62 Hypertrophy of breast: Secondary | ICD-10-CM

## 2022-08-25 DIAGNOSIS — Z6836 Body mass index (BMI) 36.0-36.9, adult: Secondary | ICD-10-CM

## 2022-08-25 DIAGNOSIS — M25512 Pain in left shoulder: Secondary | ICD-10-CM

## 2022-08-25 NOTE — Progress Notes (Signed)
Patient ID: Beverly Ramos, female    DOB: Mar 29, 2001, 21 y.o.   MRN: [id number]   Chief Complaint  Patient presents with   Advice Only   Breast Problem    Mammary Hyperplasia: The patient is a 21 y.o. female with a history of mammary hyperplasia for several years.  She has extremely large breasts causing symptoms that include the following: Back pain in the upper and lower back, including neck pain. She pulls or pins her bra straps to provide better lift and relief of the pressure and pain. She notices relief by holding her breast up manually.  Her shoulder straps cause grooves and pain and pressure that requires padding for relief. Pain medication is sometimes required with motrin and tylenol.  Activities that are hindered by enlarged breasts include: exercise and running.  She has tried supportive clothing as well as fitted bras without improvement.  Her breasts are extremely large and fairly symmetric.  She has hyperpigmentation of the inframammary area on both sides.  The sternal to nipple distance on the right is 36 cm and the left is 39 cm.  The IMF distance is 19 cm.  She is 5 feet 7 inches tall and weighs 233 pounds.  The BMI = 36.5 kg/m.  Preoperative bra size = G cup.  She would like to be a D or double D bra size.  The estimated excess breast tissue to be removed at the time of surgery = 750 grams on the left and 750 grams on the right.  Mammogram history: None.  Family history of breast cancer: None.  Tobacco use: None.   The patient expresses the desire to pursue surgical intervention.  She had a child in October 2022.  She only breast-fed for 1 month.  She is trying to decrease her weight to be under 200 pounds.     Review of Systems  Constitutional: Negative.   Eyes: Negative.   Respiratory: Negative.    Cardiovascular: Negative.  Negative for leg swelling.  Gastrointestinal: Negative.  Negative for abdominal pain.  Endocrine: Negative.   Genitourinary: Negative.    Musculoskeletal:  Positive for back pain and neck pain.  Skin:  Positive for rash.  Neurological: Negative.   Hematological: Negative.     Past Medical History:  Diagnosis Date   Anemia    Ankle fracture    Right   Arm fracture, left    History of postpartum hemorrhage    UTI (urinary tract infection)     Past Surgical History:  Procedure Laterality Date   DILATION AND CURETTAGE OF UTERUS N/A 08/08/2021   Procedure: DILATATION AND CURETTAGE;  Surgeon: Christophe Louis, MD;  Location: [place] LD ORS;  Service: Gynecology;  Laterality: N/A;   NO PAST SURGERIES        Current Outpatient Medications:    famotidine (PEPCID) 40 MG tablet, Take 1 tablet (40 mg total) by mouth every evening., Disp: 30 tablet, Rfl: 0   ibuprofen (ADVIL) 600 MG tablet, Take 1 tablet (600 mg total) by mouth every 6 (six) hours., Disp: 30 tablet, Rfl: 0   iron polysaccharides (NIFEREX) 150 MG capsule, Take 1 capsule (150 mg total) by mouth daily., Disp: 30 capsule, Rfl: 3   Prenatal Vit-Fe Fumarate-FA (PRENATAL MULTIVITAMIN) TABS tablet, Take 1 tablet by mouth daily at 12 noon., Disp: , Rfl:    Objective:   Vitals:   08/25/22 1041  BP: (!) 143/92  Pulse: (!) 105  SpO2: 97%    Physical  Exam Vitals reviewed.  Constitutional:      Appearance: Normal appearance.  HENT:     Head: Normocephalic and atraumatic.  Cardiovascular:     Rate and Rhythm: Normal rate.     Pulses: Normal pulses.  Pulmonary:     Effort: Pulmonary effort is normal.  Abdominal:     General: There is no distension.     Palpations: Abdomen is soft.  Skin:    General: Skin is warm.     Capillary Refill: Capillary refill takes less than 2 seconds.     Coloration: Skin is not jaundiced.     Findings: No bruising.  Neurological:     Mental Status: She is alert and oriented to person, place, and time.  Psychiatric:        Mood and Affect: Mood normal.        Behavior: Behavior normal.        Thought Content: Thought content normal.         Judgment: Judgment normal.     Assessment & Plan:  Symptomatic mammary hypertrophy  Chronic bilateral thoracic back pain  Neck pain  The procedure the patient selected and that was best for the patient was discussed. The risk were discussed and include but not limited to the following:  Breast asymmetry, fluid accumulation, firmness of the breast, inability to breast feed, loss of nipple or areola, skin loss, change in skin and nipple sensation, fat necrosis of the breast tissue, bleeding, infection and healing delay.  There are risks of anesthesia and injury to nerves or blood vessels.  Allergic reaction to tape, suture and skin glue are possible.  There will be swelling.  Any of these can lead to the need for revisional surgery.  A breast reduction has potential to interfere with diagnostic procedures in the future.  This procedure is best done when the breast is fully developed.  Changes in the breast will continue to occur over time: pregnancy, weight gain or weigh loss.    Total time: 40 minutes. This includes time spent with the patient during the visit as well as time spent before and after the visit reviewing the chart, documenting the encounter, ordering pertinent studies and literature for the patient.   Physical therapy: Ordered Mammogram: Not indicated  The patient will do her physical therapy and then come back to see [place].  If she is able to decrease her weight that will also help with preauthorization.  I think that she is a good candidate for bilateral breast reduction with possible liposuction.  I do encourage her to eat to try to get under 200 pounds.  Pictures were obtained of the patient and placed in the chart with the patient's or guardian's permission.   Alena Bills Nyree Yonker, DO

## 2022-09-01 ENCOUNTER — Ambulatory Visit: Payer: BC Managed Care – PPO | Admitting: Physical Therapy

## 2022-10-13 ENCOUNTER — Ambulatory Visit: Payer: BC Managed Care – PPO | Admitting: Surgical

## 2022-12-03 IMAGING — US US RENAL
1 series · 14 of 25 positions shown · non-contrast
Comparison: None.

CLINICAL DATA: Or current bilateral flank pain, 33 weeks pregnant

EXAM:
RENAL / URINARY TRACT ULTRASOUND COMPLETE

[Series 1: us renal · 0.23mm/px · 14 of 94 slices shown]
[im 1/94]
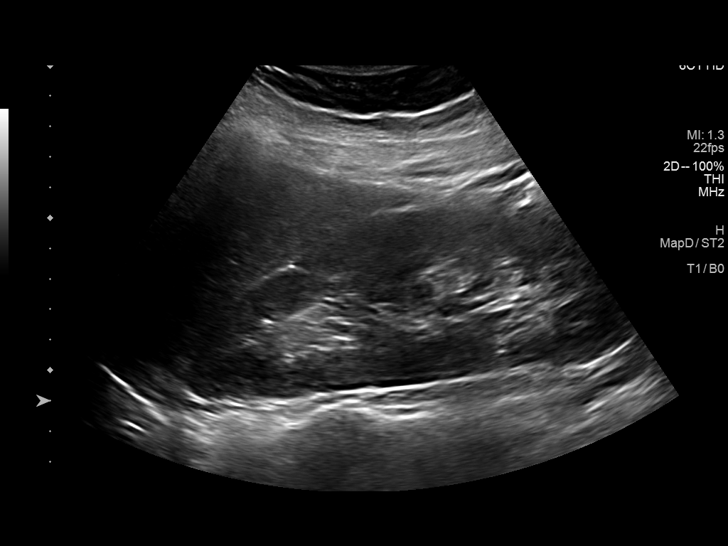
[im 8/94]
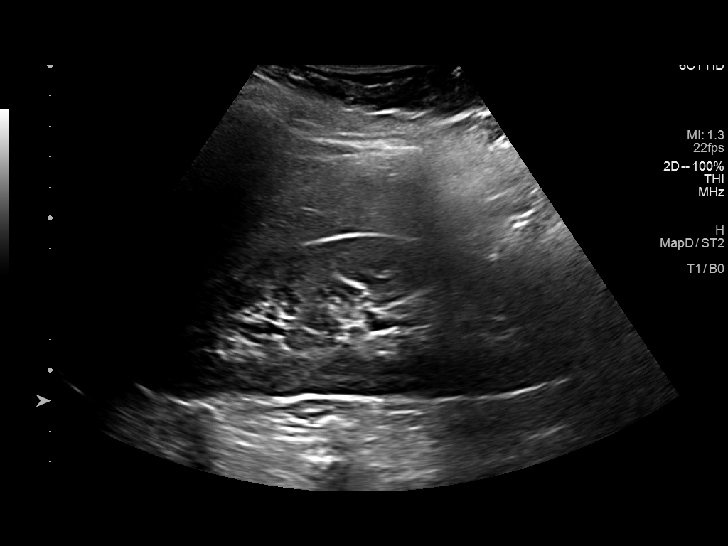
[im 16/94]
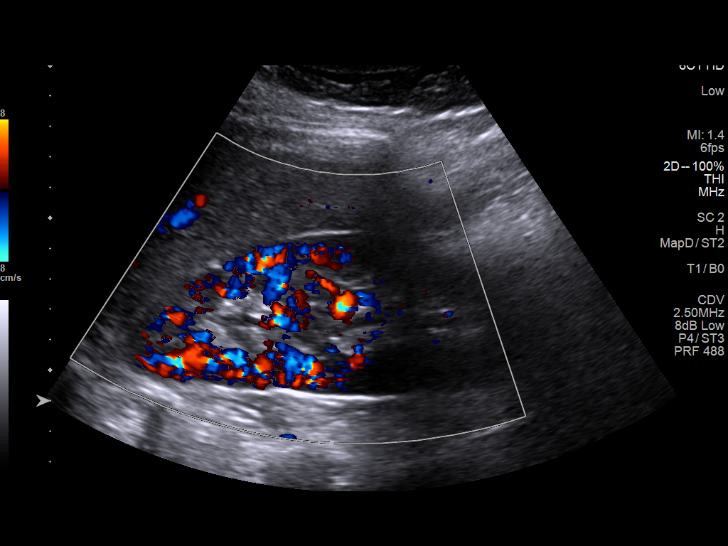
[im 24/94]
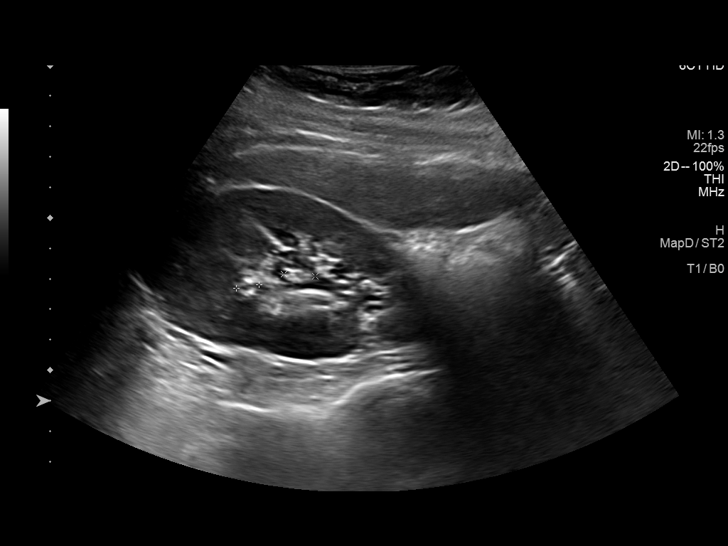
[im 32/94]
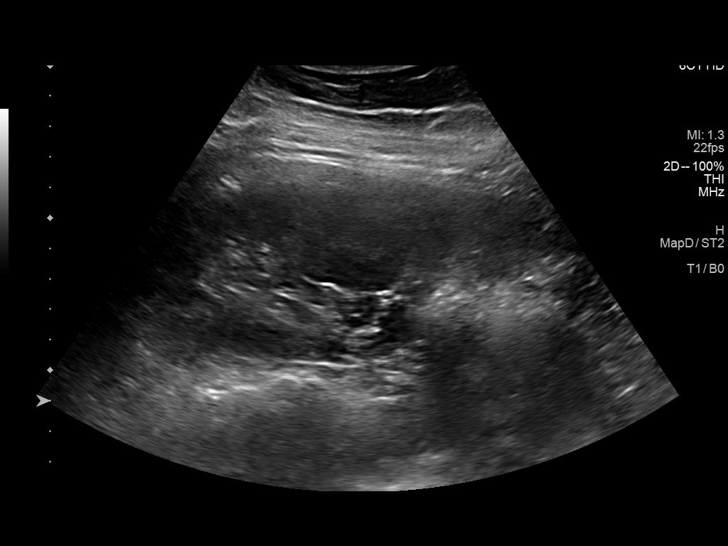
[im 35/94]
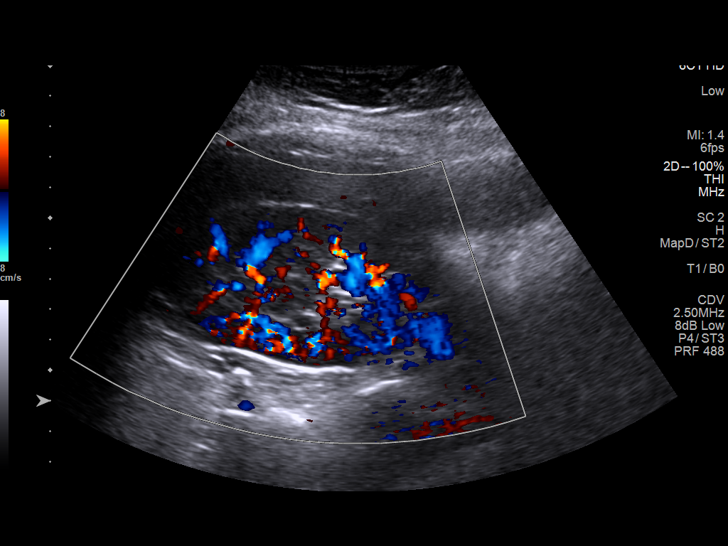
[im 43/94]
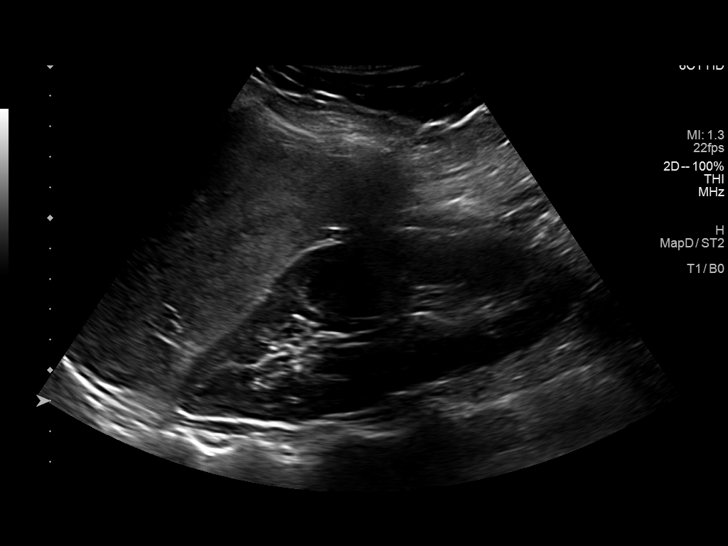
[im 51/94]
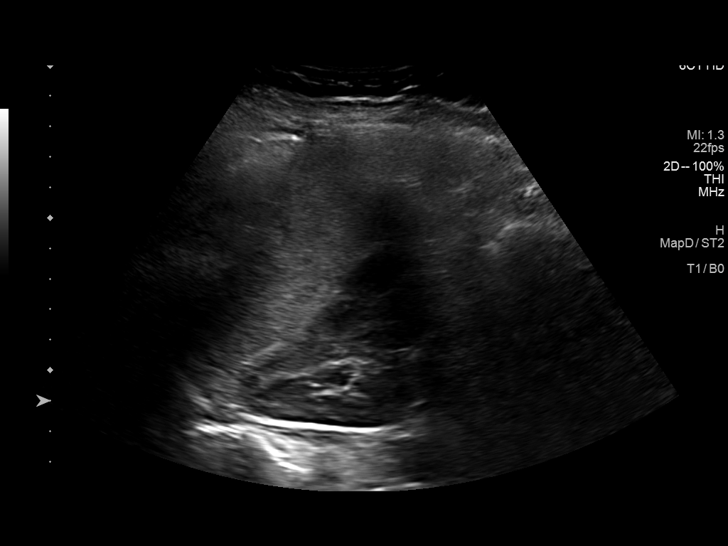
[im 59/94]
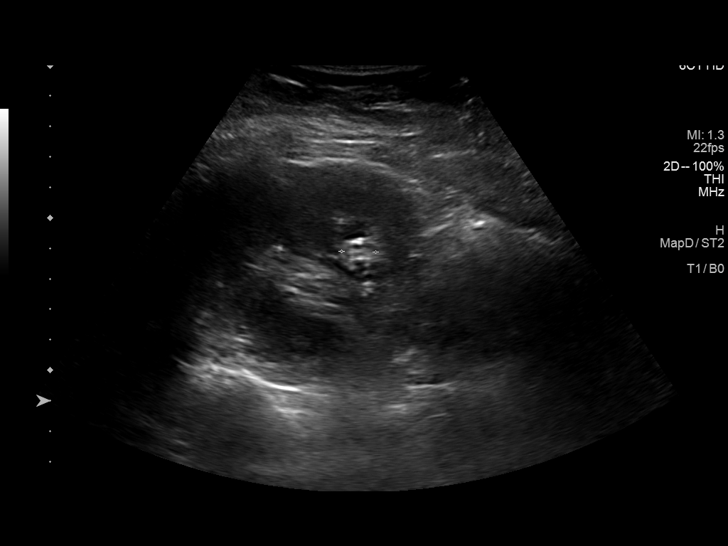
[im 63/94]
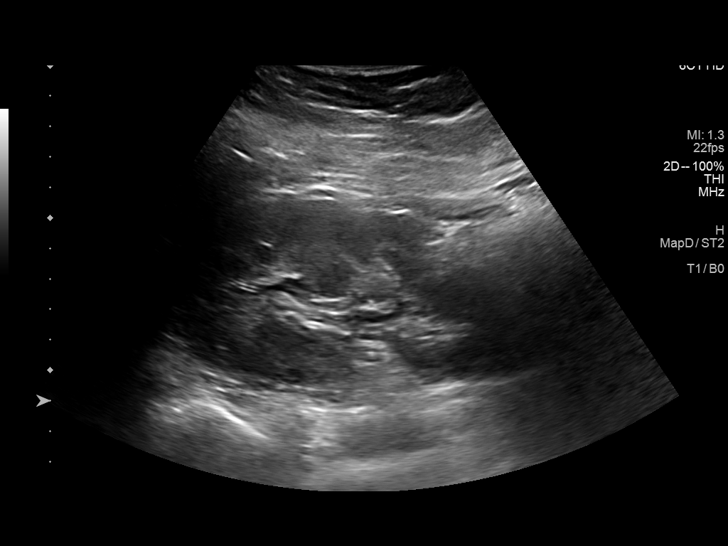
[im 70/94]
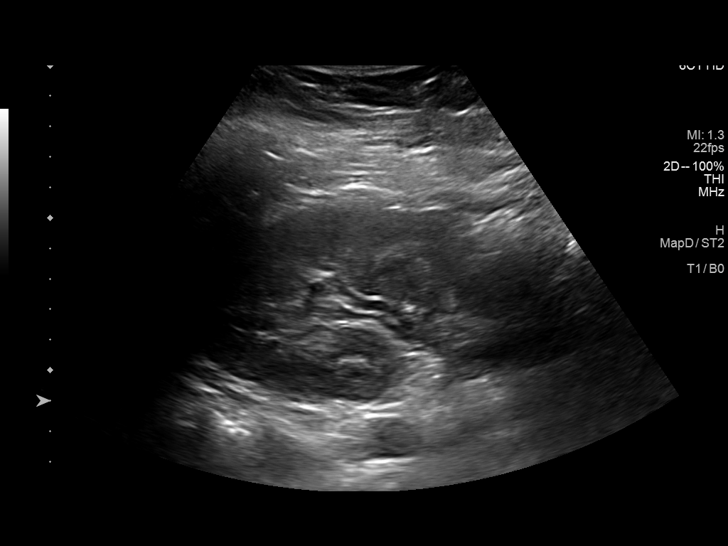
[im 78/94]
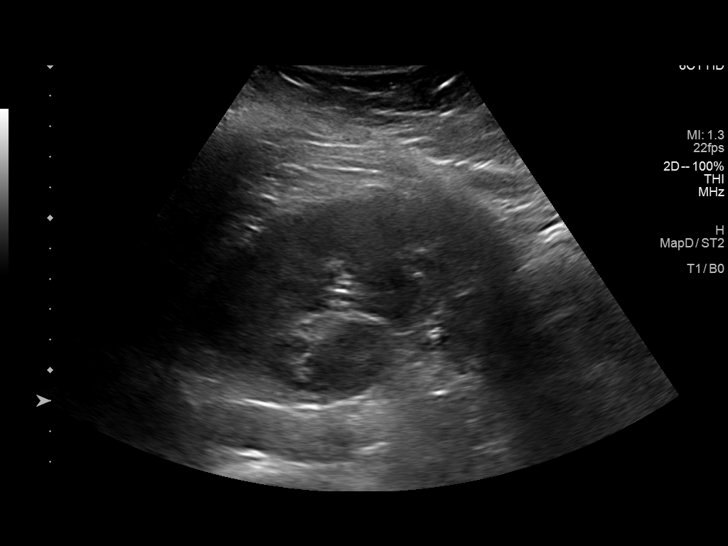
[im 86/94]
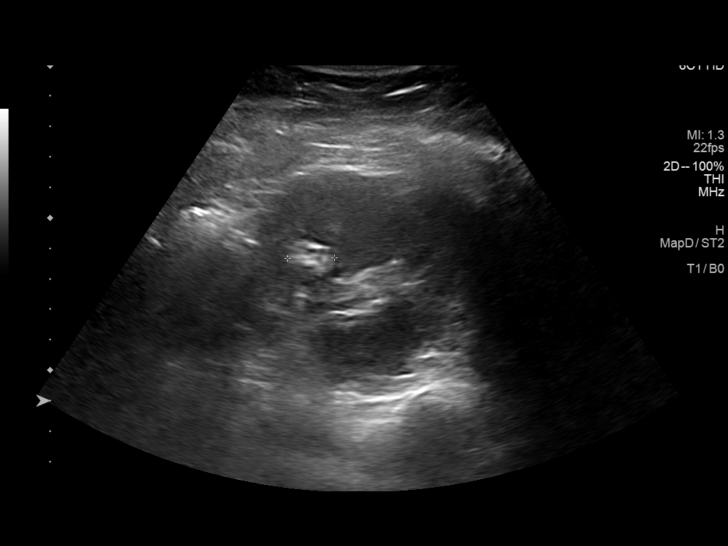
[im 94/94]
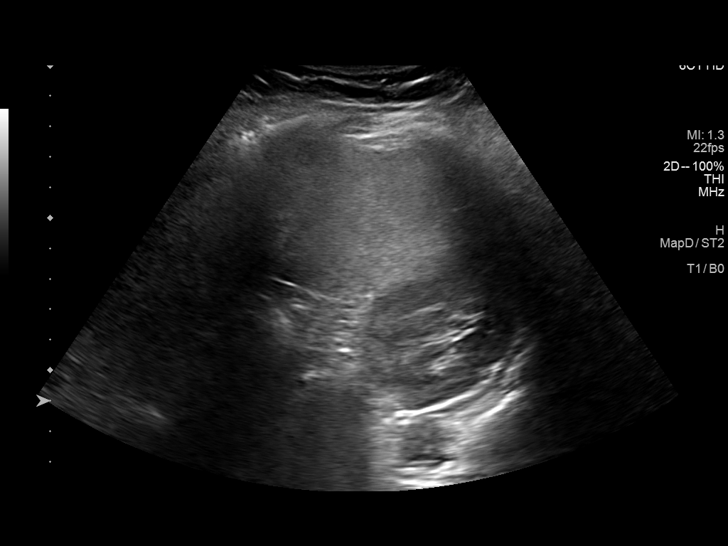

[14 of 25 positions shown; findings below may reference images not displayed]

FINDINGS: Right Kidney:

Renal measurements: 15.7 x 6.1 by 7.2 cm = volume: 3 inter 62.2 mL.
Normal echogenicity. Mild distension of the right renal pelvis may
be physiologic dilatation given gravid state. Two nonobstructing
calculi within the mid right kidney measure up to 8 mm.

Left Kidney:

Renal measurements: 14.5 x 5.3 x 9.0 cm = volume: 359.1 mL. Normal
echogenicity. Mild distension of the left renal pelvis may be
physiologic dilation given gravid state. There is an 8 mm
nonobstructing calculus within the mid left kidney.

Bladder:

Appears normal for degree of bladder distention.

Other:

None.
IMPRESSION: 1. Mild dilatation of the bilateral renal pelves, likely physiologic
distension due to gravid state.
2. Bilateral nonobstructing renal calculi.
